# Patient Record
Sex: Male | Born: 1980 | Race: White | Hispanic: No | Marital: Married | Smoking: Never smoker
Health system: Southern US, Community
[De-identification: ages and names within clinical notes are randomized; demographics above are authoritative.]

## PROBLEM LIST (undated history)

## (undated) DIAGNOSIS — M26629 Arthralgia of temporomandibular joint, unspecified side: Secondary | ICD-10-CM

## (undated) DIAGNOSIS — W5522XA Struck by cow, initial encounter: Secondary | ICD-10-CM

## (undated) DIAGNOSIS — B6000 Babesiosis, unspecified: Secondary | ICD-10-CM

## (undated) HISTORY — PX: TONSILLECTOMY: SUR1361

## (undated) HISTORY — DX: Babesiosis, unspecified: B60.00

## (undated) HISTORY — DX: Struck by cow, initial encounter: W55.22XA

## (undated) HISTORY — DX: Arthralgia of temporomandibular joint, unspecified side: M26.629

---

## 2013-02-11 ENCOUNTER — Encounter: Payer: Self-pay | Admitting: General Practice

## 2013-02-11 ENCOUNTER — Ambulatory Visit (INDEPENDENT_AMBULATORY_CARE_PROVIDER_SITE_OTHER): Payer: BC Managed Care – PPO | Admitting: General Practice

## 2013-02-11 VITALS — BP 121/80 | HR 51 | Temp 97.4°F | Ht 69.0 in | Wt 167.5 lb

## 2013-02-11 DIAGNOSIS — L0291 Cutaneous abscess, unspecified: Secondary | ICD-10-CM

## 2013-02-11 DIAGNOSIS — L0390 Cellulitis, unspecified: Secondary | ICD-10-CM

## 2013-02-11 MED ORDER — DOXYCYCLINE HYCLATE 100 MG PO TABS: ORAL_TABLET | ORAL | Status: DC

## 2013-02-11 NOTE — Progress Notes (Signed)
  Subjective:    Patient ID: Stephen Oliver, male    DOB: 1981/01/16, 32 y.o.   MRN: [id number]  HPI Presents today with red area to left lateral foot. Reports noticing this area last Wednesday and it has gotten better, but unresolved. Denies any drainage. Denies being biten or removing tick from this area, although he removed 7 ticks from other areas in the last week. Denies OTC medications.     Review of Systems  Constitutional: Negative for fever and chills.  Respiratory: Negative for cough and chest tightness.   Cardiovascular: Negative for chest pain.  Gastrointestinal: Negative for abdominal distention.  Genitourinary: Negative for difficulty urinating.  Musculoskeletal: Negative.   Skin:       Red,  area to left lateral foot  Neurological: Negative for dizziness and headaches.  Psychiatric/Behavioral: Negative.        Objective:   Physical Exam  Constitutional: He is oriented to person, place, and time. He appears well-developed and well-nourished.  Cardiovascular: Normal rate, regular rhythm and normal heart sounds.   No murmur heard. Pulmonary/Chest: Effort normal and breath sounds normal.  Neurological: He is alert and oriented to person, place, and time.  Skin: Skin is warm and dry. Rash noted.  Erythematous area to left lateral foot. Slightly edematous, non-pitting. Negative drainage. Non-tender with palpation.   Psychiatric: He has a normal mood and affect.          Assessment & Plan:  Cellulitis - Plan: doxycycline (VIBRA-TABS) 100 MG tablet Continue antibiotics even if feeling better Increase fluid intake Keep affected area clean and dry RTO if symptoms worsen or unresolved Patient verbalized understanding Coralie Keens, FNP-C

## 2013-02-11 NOTE — Patient Instructions (Addendum)
Cellulitis Cellulitis is an infection of the skin and the tissue beneath it. The infected area is usually red and tender. Cellulitis occurs most often in the arms and lower legs.   CAUSES   Cellulitis is caused by bacteria that enter the skin through cracks or cuts in the skin. The most common types of bacteria that cause cellulitis are Staphylococcus and Streptococcus. SYMPTOMS    Redness and warmth.   Swelling.   Tenderness or pain.   Fever.  DIAGNOSIS  Your caregiver can usually determine what is wrong based on a physical exam. Blood tests may also be done. TREATMENT   Treatment usually involves taking an antibiotic medicine. HOME CARE INSTRUCTIONS    Take your antibiotics as directed. Finish them even if you start to feel better.   Keep the infected arm or leg elevated to reduce swelling.   Apply a warm cloth to the affected area up to 4 times per day to relieve pain.   Only take over-the-counter or prescription medicines for pain, discomfort, or fever as directed by your caregiver.   Keep all follow-up appointments as directed by your caregiver.  SEEK MEDICAL CARE IF:    You notice red streaks coming from the infected area.   Your red area gets larger or turns dark in color.   Your bone or joint underneath the infected area becomes painful after the skin has healed.   Your infection returns in the same area or another area.   You notice a swollen bump in the infected area.   You develop new symptoms.  SEEK IMMEDIATE MEDICAL CARE IF:    You have a fever.   You feel very sleepy.   You develop vomiting or diarrhea.   You have a general ill feeling (malaise) with muscle aches and pains.  MAKE SURE YOU:    Understand these instructions.   Will watch your condition.   Will get help right away if you are not doing well or get worse.  Document Released: 06/25/2005 Document Revised: 03/16/2012 Document Reviewed: 12/01/2011 Exit[employer] Patient Information 2013  [employer].    

## 2013-10-06 ENCOUNTER — Other Ambulatory Visit: Payer: Self-pay | Admitting: Nurse Practitioner

## 2013-10-06 MED ORDER — CEPHALEXIN 500 MG PO CAPS: 500.0000 mg | ORAL_CAPSULE | Freq: Three times a day (TID) | ORAL | Status: DC

## 2013-10-19 ENCOUNTER — Telehealth: Payer: Self-pay | Admitting: General Practice

## 2013-10-19 NOTE — Telephone Encounter (Signed)
I didn't see a tetanus on file. Appt made to be seen.

## 2013-10-20 ENCOUNTER — Ambulatory Visit (INDEPENDENT_AMBULATORY_CARE_PROVIDER_SITE_OTHER): Payer: BC Managed Care – PPO | Admitting: Family Medicine

## 2013-10-20 ENCOUNTER — Encounter: Payer: Self-pay | Admitting: Family Medicine

## 2013-10-20 VITALS — BP 112/78 | HR 58 | Temp 97.5°F | Ht 69.0 in | Wt 173.4 lb

## 2013-10-20 DIAGNOSIS — Z23 Encounter for immunization: Secondary | ICD-10-CM

## 2013-10-20 DIAGNOSIS — T148XXA Other injury of unspecified body region, initial encounter: Secondary | ICD-10-CM

## 2013-10-20 NOTE — Progress Notes (Signed)
   Subjective:    Patient ID: Stephen Oliver, male    DOB: 12-11-1980, 33 y.o.   MRN: [id number]  HPI This 33 y.o. male presents for evaluation of left foot puncture wound from Nail when he stepped on it a few days ago.  He needs a tetanus shot.   Review of Systems C/o puncture wound    No chest pain, SOB, HA, dizziness, vision change, N/V, diarrhea, constipation, dysuria, urinary urgency or frequency, myalgias, arthralgias or rash.  Objective:   Physical Exam Vital signs noted  Well developed well nourished male.  Right foot with puncture wound right bottom of foot.  No erythema or DC from the Puncture wound.       Assessment & Plan:  Need for Tdap vaccination - Plan: Tdap vaccine greater than or equal to 7yo IM  Puncture wound right foot.  Continue keflex.  Tylenol and motrin otc.  Deatra CanterWilliam J Oxford FNP

## 2013-10-20 NOTE — Progress Notes (Signed)
Tolerated injection well. 

## 2013-10-20 NOTE — Patient Instructions (Signed)
TDAPTetanus, Diphtheria, Pertussis (Tdap) Vaccine What You Need to Know WHY GET VACCINATED? Tetanus, diphtheria and pertussis can be very serious diseases, even for adolescents and adults. Tdap vaccine can protect us from these diseases. TETANUS (Lockjaw) causes painful muscle tightening and stiffness, usually all over the body.  It can lead to tightening of muscles in the head and neck so you can't open your mouth, swallow, or sometimes even breathe. Tetanus kills about 1 out of 5 people who are infected. DIPHTHERIA can cause a thick coating to form in the back of the throat.  It can lead to breathing problems, paralysis, heart failure, and death. PERTUSSIS (Whooping Cough) causes severe coughing spells, which can cause difficulty breathing, vomiting and disturbed sleep.  It can also lead to weight loss, incontinence, and rib fractures. Up to 2 in 100 adolescents and 5 in 100 adults with pertussis are hospitalized or have complications, which could include pneumonia and death. These diseases are caused by bacteria. Diphtheria and pertussis are spread from person to person through coughing or sneezing. Tetanus enters the body through cuts, scratches, or wounds. Before vaccines, the [place] saw as many as 200,000 cases a year of diphtheria and pertussis, and hundreds of cases of tetanus. Since vaccination began, tetanus and diphtheria have dropped by about 99% and pertussis by about 80%. TDAP VACCINE Tdap vaccine can protect adolescents and adults from tetanus, diphtheria, and pertussis. One dose of Tdap is routinely given at age 34 or 1012. People who did not get Tdap at that age should get it as soon as possible. Tdap is especially important for health care professionals and anyone having close contact with a baby younger than 12 months. Pregnant women should get a dose of Tdap during every pregnancy, to protect the newborn from pertussis. Infants are most at risk for severe, life-threatening  complications from pertussis. A similar vaccine, called Td, protects from tetanus and diphtheria, but not pertussis. A Td booster should be given every 10 years. Tdap may be given as one of these boosters if you have not already gotten a dose. Tdap may also be given after a severe cut or burn to prevent tetanus infection. Your doctor can give you more information. Tdap may safely be given at the same time as other vaccines. SOME PEOPLE SHOULD NOT GET THIS VACCINE  If you ever had a life-threatening allergic reaction after a dose of any tetanus, diphtheria, or pertussis containing vaccine, OR if you have a severe allergy to any part of this vaccine, you should not get Tdap. Tell your doctor if you have any severe allergies.  If you had a coma, or long or multiple seizures within 7 days after a childhood dose of DTP or DTaP, you should not get Tdap, unless a cause other than the vaccine was found. You can still get Td.  Talk to your doctor if you:  have epilepsy or another nervous system problem,  had severe pain or swelling after any vaccine containing diphtheria, tetanus or pertussis,  ever had Guillain-Barr Syndrome (GBS),  aren't feeling well on the day the shot is scheduled. RISKS OF A VACCINE REACTION With any medicine, including vaccines, there is a chance of side effects. These are usually mild and go away on their own, but serious reactions are also possible. Brief fainting spells can follow a vaccination, leading to injuries from falling. Sitting or lying down for about 15 minutes can help prevent these. Tell your doctor if you feel dizzy or light-headed, or  have vision changes or ringing in the ears. Mild problems following Tdap (Did not interfere with activities)  Pain where the shot was given (about 3 in 4 adolescents or 2 in 3 adults)  Redness or swelling where the shot was given (about 1 person in 5)  Mild fever of at least 100.4F (up to about 1 in 25 adolescents or 1 in  100 adults)  Headache (about 3 or 4 people in 10)  Tiredness (about 1 person in 3 or 4)  Nausea, vomiting, diarrhea, stomach ache (up to 1 in 4 adolescents or 1 in 10 adults)  Chills, body aches, sore joints, rash, swollen glands (uncommon) Moderate problems following Tdap (Interfered with activities, but did not require medical attention)  Pain where the shot was given (about 1 in 5 adolescents or 1 in 100 adults)  Redness or swelling where the shot was given (up to about 1 in 16 adolescents or 1 in 25 adults)  Fever over 102F (about 1 in 100 adolescents or 1 in 250 adults)  Headache (about 3 in 20 adolescents or 1 in 10 adults)  Nausea, vomiting, diarrhea, stomach ache (up to 1 or 3 people in 100)  Swelling of the entire arm where the shot was given (up to about 3 in 100). Severe problems following Tdap (Unable to perform usual activities, required medical attention)  Swelling, severe pain, bleeding and redness in the arm where the shot was given (rare). A severe allergic reaction could occur after any vaccine (estimated less than 1 in a million doses). WHAT IF THERE IS A SERIOUS REACTION? What should I look for?  Look for anything that concerns you, such as signs of a severe allergic reaction, very high fever, or behavior changes. Signs of a severe allergic reaction can include hives, swelling of the face and throat, difficulty breathing, a fast heartbeat, dizziness, and weakness. These would start a few minutes to a few hours after the vaccination. What should I do?  If you think it is a severe allergic reaction or other emergency that can't wait, call 9-1-1 or get the person to the nearest hospital. Otherwise, call your doctor.  Afterward, the reaction should be reported to the "Vaccine Adverse Event Reporting System" (VAERS). Your doctor might file this report, or you can do it yourself through the VAERS web site at www.vaers.hhs.gov, or by calling 1-800-822-7967. VAERS is  only for reporting reactions. They do not give medical advice.  THE NATIONAL VACCINE INJURY COMPENSATION PROGRAM The National Vaccine Injury Compensation Program (VICP) is a federal program that was created to compensate people who may have been injured by certain vaccines. Persons who believe they may have been injured by a vaccine can learn about the program and about filing a claim by calling 1-800-338-2382 or visiting the VICP website at www.hrsa.gov/vaccinecompensation. HOW CAN I LEARN MORE?  Ask your doctor.  Call your local or state health department.  Contact the Centers for Disease Control and Prevention (CDC):  Call 1-800-232-4636 or visit CDC's website at www.cdc.gov/vaccines. CDC Tdap Vaccine VIS (02/05/12) Document Released: 03/16/2012 Document Revised: 01/10/2013 Document Reviewed: 01/05/2013 ExitCare Patient Information 2014 [employer].  

## 2015-05-11 ENCOUNTER — Ambulatory Visit (INDEPENDENT_AMBULATORY_CARE_PROVIDER_SITE_OTHER): Payer: BLUE CROSS/BLUE SHIELD | Admitting: Family Medicine

## 2015-05-11 ENCOUNTER — Encounter (INDEPENDENT_AMBULATORY_CARE_PROVIDER_SITE_OTHER): Payer: Self-pay

## 2015-05-11 ENCOUNTER — Encounter: Payer: Self-pay | Admitting: Family Medicine

## 2015-05-11 VITALS — BP 118/81 | HR 63 | Temp 97.1°F | Ht 69.0 in | Wt 177.0 lb

## 2015-05-11 DIAGNOSIS — L03116 Cellulitis of left lower limb: Secondary | ICD-10-CM

## 2015-05-11 MED ORDER — DOXYCYCLINE HYCLATE 100 MG PO TABS
100.0000 mg | ORAL_TABLET | Freq: Two times a day (BID) | ORAL | Status: DC
Start: 2015-05-11 — End: 2017-11-06

## 2015-05-11 NOTE — Progress Notes (Signed)
Subjective:  Patient ID: Stephen Oliver, male    DOB: Feb 08, 1981  Age: 34 y.o. MRN: [id number]  CC: Insect Bite   HPI Stephen Oliver presents for erythematous lesion at the left inner thigh. No known bite. Concerned about spider possibility. No relief with use of triple antibiotic. Present for 2 days. It started out as a slight red bump perhaps a millimeter. It spread to about the size of a dime by last night by this morning it was the size of a quarter. By noon the size of a half-dollar. Now much bigger. It is mildly painful 2/10. Primarily stinging sensation made worse by pressure.  History Stephen Oliver has no past medical history on file.   He has past surgical history that includes Tonsillectomy.   His family history includes Cancer in his mother; Heart disease in his father.He reports that he has never smoked. He does not have any smokeless tobacco history on file. He reports that he does not drink alcohol or use illicit drugs.  Outpatient Prescriptions Prior to Visit  Medication Sig Dispense Refill  . cephALEXin (KEFLEX) 500 MG capsule Take 1 capsule (500 mg total) by mouth 3 (three) times daily. 30 capsule 0   No facility-administered medications prior to visit.    ROS Review of Systems  Constitutional: Negative for fever, chills and diaphoresis.  HENT: Negative.  Negative for congestion.   Respiratory: Negative.   Cardiovascular: Negative.   Gastrointestinal: Negative for nausea, vomiting, abdominal pain, diarrhea, constipation and abdominal distention.  Musculoskeletal: Negative for arthralgias.  Skin: Negative for rash.  Neurological: Negative for headaches.    Objective:  BP 118/81 mmHg  Pulse 63  Temp(Src) 97.1 F (36.2 C) (Oral)  Ht 5\' 9"  (1.753 m)  Wt 177 lb (80.287 kg)  BMI 26.13 kg/m2  BP Readings from Last 3 Encounters:  05/11/15 118/81  10/20/13 112/78  02/11/13 121/80    Wt Readings from Last 3 Encounters:  05/11/15 177 lb (80.287 kg)  10/20/13 173 lb 6.4 oz  (78.654 kg)  02/11/13 167 lb 8 oz (75.978 kg)     Physical Exam  Constitutional: He is oriented to person, place, and time. He appears well-developed and well-nourished. No distress.  HENT:  Head: Normocephalic and atraumatic.  Right Ear: External ear normal.  Left Ear: External ear normal.  Nose: Nose normal.  Mouth/Throat: Oropharynx is clear and moist.  Eyes: Conjunctivae and EOM are normal. Pupils are equal, round, and reactive to light.  Neck: Normal range of motion. Neck supple. No thyromegaly present.  Cardiovascular: Normal rate, regular rhythm and normal heart sounds.   No murmur heard. Pulmonary/Chest: Effort normal and breath sounds normal. No respiratory distress. He has no wheezes. He has no rales.  Abdominal: Soft. Bowel sounds are normal. He exhibits no distension. There is no tenderness.  Lymphadenopathy:    He has no cervical adenopathy.  Neurological: He is alert and oriented to person, place, and time. He has normal reflexes.  Skin: Skin is warm and dry. Rash (there is moderate pink ery of about 9 cm on the medial aspect of the thigh just proximal to the mid thigh. This has a 2 mm central bleb. There is no ulceration. This is tender to light palpation. There is no fluctuance.) noted.  Psychiatric: He has a normal mood and affect. His behavior is normal. Judgment and thought content normal.    No results found for: HGBA1C  No results found for: WBC, HGB, HCT, PLT, GLUCOSE, CHOL, TRIG, HDL, LDLDIRECT,  LDLCALC, ALT, AST, NA, K, CL, CREATININE, BUN, CO2, TSH, PSA, INR, GLUF, HGBA1C, MICROALBUR  Patient was never admitted.  Assessment & Plan:   Stephen Oliver was seen today for insect bite.  Diagnoses and all orders for this visit:  Cellulitis of leg, left  Other orders -     doxycycline (VIBRA-TABS) 100 MG tablet; Take 1 tablet (100 mg total) by mouth 2 (two) times daily.  I have discontinued [name] cephALEXin. I am also having him start on doxycycline.  Meds  ordered this encounter  Medications  . doxycycline (VIBRA-TABS) 100 MG tablet    Sig: Take 1 tablet (100 mg total) by mouth 2 (two) times daily.    Dispense:  20 tablet    Refill:  0     Follow-up: Return if symptoms worsen or fail to improve.  Mechele Claude, M.D.

## 2015-12-25 ENCOUNTER — Other Ambulatory Visit: Payer: Self-pay | Admitting: Family Medicine

## 2015-12-25 MED ORDER — OSELTAMIVIR PHOSPHATE 75 MG PO CAPS: 75.0000 mg | ORAL_CAPSULE | Freq: Every day | ORAL | Status: DC

## 2015-12-25 NOTE — Telephone Encounter (Signed)
Tamiflu for PPx sent, daughter seen today and is flu B positive, per wife's request.   Murtis SinkSam Debbi Strandberg, MD [facility] 12/25/2015, 1:45 PM

## 2016-10-08 ENCOUNTER — Telehealth: Payer: Self-pay

## 2016-10-08 MED ORDER — AMOXICILLIN-POT CLAVULANATE 875-125 MG PO TABS
1.0000 | ORAL_TABLET | Freq: Two times a day (BID) | ORAL | 0 refills | Status: DC
Start: 2016-10-08 — End: 2017-11-06

## 2016-10-08 NOTE — Telephone Encounter (Signed)
Please send in a scrip to the local pharmacy where he is staying. Use Augmentin 875 BID X 10 days.

## 2016-10-08 NOTE — Telephone Encounter (Signed)
[facility] [place] [facility] [place] Rx sent.

## 2016-10-08 NOTE — Telephone Encounter (Signed)
Patient works out of town and has a terrible sinus infection. He is unable to come in at this time for an appt. Are their any suggestions for what he can do. Not sure when he will be able to come in. Patient works outdoors. Please advise and route back to sender and I will contact

## 2016-10-20 ENCOUNTER — Other Ambulatory Visit: Payer: Self-pay | Admitting: Family Medicine

## 2016-10-21 ENCOUNTER — Telehealth: Payer: Self-pay

## 2016-10-21 ENCOUNTER — Other Ambulatory Visit: Payer: Self-pay | Admitting: Nurse Practitioner

## 2016-10-21 MED ORDER — OSELTAMIVIR PHOSPHATE 75 MG PO CAPS: 75.0000 mg | ORAL_CAPSULE | Freq: Every day | ORAL | 0 refills | Status: DC

## 2016-10-21 NOTE — Telephone Encounter (Signed)
He and his family were exposed to nephew yesterday who was diagnosed with the flu last night. [facility] [place]

## 2017-11-06 ENCOUNTER — Encounter: Payer: Self-pay | Admitting: Pediatrics

## 2017-11-06 ENCOUNTER — Ambulatory Visit: Payer: BLUE CROSS/BLUE SHIELD | Admitting: Pediatrics

## 2017-11-06 VITALS — BP 138/84 | HR 60 | Temp 97.0°F | Ht 69.0 in | Wt 164.8 lb

## 2017-11-06 DIAGNOSIS — R202 Paresthesia of skin: Secondary | ICD-10-CM | POA: Diagnosis not present

## 2017-11-06 DIAGNOSIS — R200 Anesthesia of skin: Secondary | ICD-10-CM

## 2017-11-06 DIAGNOSIS — R030 Elevated blood-pressure reading, without diagnosis of hypertension: Secondary | ICD-10-CM | POA: Diagnosis not present

## 2017-11-06 LAB — URINALYSIS, COMPLETE
Bilirubin, UA: NEGATIVE
GLUCOSE, UA: NEGATIVE
KETONES UA: NEGATIVE
Leukocytes, UA: NEGATIVE
NITRITE UA: NEGATIVE
PROTEIN UA: NEGATIVE
SPEC GRAV UA: 1.015 (ref 1.005–1.030)
UUROB: 0.2 mg/dL (ref 0.2–1.0)
pH, UA: 7 (ref 5.0–7.5)

## 2017-11-06 LAB — MICROSCOPIC EXAMINATION
Bacteria, UA: NONE SEEN
Epithelial Cells (non renal): NONE SEEN /hpf (ref 0–10)
Renal Epithel, UA: NONE SEEN /hpf
WBC UA: NONE SEEN /HPF (ref 0–?)
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
## 2017-11-07 LAB — CMP14+EGFR
A/G RATIO: 2.3 — AB (ref 1.2–2.2)
ALBUMIN: 5 g/dL (ref 3.5–5.5)
ALK PHOS: 63 IU/L (ref 39–117)
ALT: 10 IU/L (ref 0–44)
AST: 11 IU/L (ref 0–40)
BILIRUBIN TOTAL: 0.4 mg/dL (ref 0.0–1.2)
BUN / CREAT RATIO: 11 (ref 9–20)
BUN: 11 mg/dL (ref 6–20)
CHLORIDE: 103 mmol/L (ref 96–106)
CO2: 23 mmol/L (ref 20–29)
Calcium: 9.3 mg/dL (ref 8.7–10.2)
Creatinine, Ser: 0.99 mg/dL (ref 0.76–1.27)
GFR calc Af Amer: 112 mL/min/{1.73_m2} (ref >59)
GFR calc non Af Amer: 97 mL/min/{1.73_m2} (ref >59)
GLOBULIN, TOTAL: 2.2 g/dL (ref 1.5–4.5)
Glucose: 101 mg/dL — ABNORMAL HIGH (ref 65–99)
Potassium: 4.5 mmol/L (ref 3.5–5.2)
SODIUM: 142 mmol/L (ref 134–144)
Total Protein: 7.2 g/dL (ref 6.0–8.5)

## 2017-11-07 LAB — CBC WITH DIFFERENTIAL/PLATELET
BASOS ABS: 0 10*3/uL (ref 0.0–0.2)
Basos: 1 %
EOS (ABSOLUTE): 0.1 10*3/uL (ref 0.0–0.4)
Eos: 1 %
HEMATOCRIT: 46.3 % (ref 37.5–51.0)
HEMOGLOBIN: 16.2 g/dL (ref 13.0–17.7)
Immature Grans (Abs): 0 10*3/uL (ref 0.0–0.1)
Immature Granulocytes: 0 %
LYMPHS ABS: 1.7 10*3/uL (ref 0.7–3.1)
Lymphs: 26 %
MCH: 29.8 pg (ref 26.6–33.0)
MCHC: 35 g/dL (ref 31.5–35.7)
MCV: 85 fL (ref 79–97)
MONOCYTES: 6 %
Monocytes Absolute: 0.4 10*3/uL (ref 0.1–0.9)
NEUTROS ABS: 4.3 10*3/uL (ref 1.4–7.0)
Neutrophils: 66 %
Platelets: 182 10*3/uL (ref 150–379)
RBC: 5.44 x10E6/uL (ref 4.14–5.80)
RDW: 13.7 % (ref 12.3–15.4)
WBC: 6.5 10*3/uL (ref 3.4–10.8)

## 2017-11-07 LAB — TSH: TSH: 2.18 u[IU]/mL (ref 0.450–4.500)

## 2017-11-09 ENCOUNTER — Encounter: Payer: Self-pay | Admitting: Neurology

## 2017-12-21 ENCOUNTER — Other Ambulatory Visit: Payer: Self-pay | Admitting: Family Medicine

## 2017-12-21 MED ORDER — AMOXICILLIN 500 MG PO CAPS: 500.0000 mg | ORAL_CAPSULE | Freq: Two times a day (BID) | ORAL | 0 refills | Status: DC

## 2017-12-21 NOTE — Progress Notes (Signed)
Multiple sick contacts within the household with strep pharyngitis.  Patient now with sore throat.  He has taken 3 of his wife's amoxicillin pills with improvement in symptoms.

## 2018-02-08 ENCOUNTER — Ambulatory Visit: Payer: Self-pay | Admitting: Neurology

## 2018-02-08 ENCOUNTER — Encounter: Payer: Self-pay | Admitting: Neurology

## 2018-02-08 VITALS — BP 110/80 | HR 70 | Ht 69.0 in | Wt 170.5 lb

## 2018-02-08 DIAGNOSIS — G5601 Carpal tunnel syndrome, right upper limb: Secondary | ICD-10-CM

## 2018-02-08 DIAGNOSIS — G5623 Lesion of ulnar nerve, bilateral upper limbs: Secondary | ICD-10-CM

## 2018-02-08 NOTE — Patient Instructions (Signed)
NCS/EMG of both arms  If your symptoms get worse, we can order MRI brain  Please try to avoid over flexion at the elbow and use a soft elbow to prevent compression of the nerve at the elbow; at night, use it has a block.  Return to clinic needed

## 2018-02-08 NOTE — Progress Notes (Signed)
[facility] Neurology Division Clinic Note - Initial Visit   Date: 02/08/18  Stephen Oliver MRN: [id number] DOB: January 21, 1981   Dear Dr. Oswaldo Done:  Thank you for your kind referral of Stephen Oliver for consultation of hand numbness. Although his history is well known to you, please allow [place] to reiterate it for the purpose of our medical record. The patient was accompanied to the clinic by self.    History of Present Illness: Stephen Oliver is a 37 y.o. left-handed Caucasian male who is otherwise healthy presenting for evaluation of left sided numbness and right hand numbness.    In January 2019, he was driving in [place] and he developed left arm numbness, which progressed to involve his left face and leg.  Symptoms lasted 30-minutes.  He stopped at a rest area and a bystander called EMS.  His blood pressure was elevated and symptoms had subsided.  He endorses being anxious when symptoms started.  He has not had left sided numbness/tingling since this time.  However, over the past 6 weeks, he has been waking up with his hands falling asleep, over the 4th and 5th digits.  This can occur when he is driving, especially on the left. Shaking his hands and stretching can help.  He denies weakness of the hands or neck pain. He admits to resting his elbow on the door and arm rest.  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   Out-side paper records, electronic medical record, and images have been reviewed where available and summarized as:  Lab Results  Component Value Date   TSH 2.180 11/06/2017   No results found for: [id number] Lab Results  Component Value Date   CREATININE 0.99 11/06/2017   BUN 11 11/06/2017   NA 142 11/06/2017   K 4.5 11/06/2017   CL 103 11/06/2017   CO2 23 11/06/2017   Past Medical History:  None  Past Surgical History:  Procedure Laterality Date  . TONSILLECTOMY       Medications:  Outpatient Encounter Medications as of 02/08/2018  Medication Sig  . [DISCONTINUED] amoxicillin (AMOXIL) 500 MG capsule Take 1 capsule (500 mg total) by mouth 2 (two) times daily.   No facility-administered encounter medications on file as of 02/08/2018.      Allergies: No Known Allergies  Family History: Family History  Problem Relation Age of Onset  . Colon cancer Mother 20       colon  . Heart disease Father   . Healthy Sister   . Stroke Paternal Grandmother     Social History: Social History   Tobacco Use  . Smoking status: Never Smoker  . Smokeless tobacco: Never Used  Substance Use Topics  . Alcohol use: No  . Drug use: No   Social History   Social History Narrative   Lives with wife and 4 children in a one story home.  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  Education: some college.     Review of Systems:  CONSTITUTIONAL: No fevers, chills, night sweats, or weight loss.   EYES: No visual changes + right eye pain ENT: No hearing changes.  No history of nose bleeds.   RESPIRATORY: No cough, wheezing and shortness of breath.   CARDIOVASCULAR: Negative  for chest pain, and palpitations.   GI: Negative for abdominal discomfort, blood in stools or black stools.  No recent change in bowel habits.   GU:  No history of incontinence.   MUSCLOSKELETAL: No history of joint pain or swelling.  No myalgias.   SKIN: Negative for lesions, rash, and itching.   HEMATOLOGY/ONCOLOGY: Negative for prolonged bleeding, bruising easily, and swollen nodes.  No history of cancer.   ENDOCRINE: Negative for cold or heat intolerance, polydipsia or goiter.   PSYCH:  No depression +anxiety symptoms.   NEURO: As Above.   Vital Signs:  BP 110/80   Pulse 70   Ht  (1.753 m)   Wt 170 lb 8 oz (77.3 kg)   SpO2 99%   BMI 25.18  kg/m    General Medical Exam:   General:  Well appearing, comfortable.   Eyes/ENT: see cranial nerve examination.   Neck: No masses appreciated.  Full range of motion without tenderness.  No carotid bruits. Respiratory:  Clear to auscultation, good air entry bilaterally.   Cardiac:  Regular rate and rhythm, no murmur.   Extremities:  No deformities, edema, or skin discoloration.  Skin:  No rashes or lesions.  Neurological Exam: MENTAL STATUS including orientation to time, place, person, recent and remote memory, attention span and concentration, language, and fund of knowledge is normal.  Speech is not dysarthric.  CRANIAL NERVES: II:  No visual field defects.  Unremarkable fundi.   III-IV-VI: Pupils equal round and reactive to light.  Normal conjugate, extra-ocular eye movements in all directions of gaze.  No nystagmus.  No ptosis.   V:  Normal facial sensation.   VII:  Normal facial symmetry and movements.    VIII:  Normal hearing and vestibular function.   IX-X:  Normal palatal movement.   XI:  Normal shoulder shrug and head rotation.   XII:  Normal tongue strength and range of motion, no deviation or fasciculation.  MOTOR:  No atrophy, fasciculations or abnormal movements.  No pronator drift.  Tone is normal.    Right Upper Extremity:    Left Upper Extremity:    Deltoid  5/5   Deltoid  5/5   Biceps  5/5   Biceps  5/5   Triceps  5/5   Triceps  5/5   Wrist extensors  5/5   Wrist extensors  5/5   Wrist flexors  5/5   Wrist flexors  5/5   Finger extensors  5/5   Finger extensors  5/5   Finger flexors  5/5   Finger flexors  5/5   Dorsal interossei  5/5   Dorsal interossei  5/5   Abductor pollicis  5/5   Abductor pollicis  5/5   Tone (Ashworth scale)  0  Tone (Ashworth scale)  0   Right Lower Extremity:    Left Lower Extremity:    Hip flexors  5/5   Hip flexors  5/5   Hip extensors  5/5   Hip extensors  5/5   Knee flexors  5/5   Knee flexors  5/5   Knee extensors  5/5   Knee  extensors  5/5   Dorsiflexors  5/5   Dorsiflexors  5/5   Plantarflexors  5/5   Plantarflexors  5/5   Toe extensors  5/5   Toe extensors  5/5   Toe flexors  5/5   Toe flexors  5/5   Tone (Ashworth scale)  0  Tone (Ashworth scale)  0  MSRs:  Right                                                                 Left brachioradialis 2+  brachioradialis 2+  biceps 2+  biceps 2+  triceps 2+  triceps 2+  patellar 2+  patellar 2+  ankle jerk 2+  ankle jerk 2+  Hoffman no  Hoffman no  plantar response down  plantar response down   SENSORY:  Normal and symmetric perception of light touch, pinprick, vibration, and proprioception.  Romberg's sign absent. Tinel's sign at the wrist wrist.  Negative Tinel's at the elbow.   COORDINATION/GAIT: Normal finger-to- nose-finger.  Intact rapid alternating movements bilaterally. Gait narrow based and stable. Tandem and stressed gait intact.    IMPRESSION: 1.  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  I will obtain NCS/EMG of the arms to localize symptoms and assess severity.  In the meantime, strategies were discussed to minimize compression of the nerve including using a soft elbow pad and using it as a block at night to prevent hyperflexion.  2.  Right carpal tunnel syndrome, mild and asymptomatic as he denies paresthesia over the median distribution but Tinel's is positive.  NCS/EMG will also assess for this.   3.  Transient left sided paresthesia, less likely TIA or stroke given lack of vascular risk factors.  It is possible that he developed left ulnar neuropathy at the elbow and the remaining symptoms were more a manifestation of anxiety.  I offered to check MRI brain to be sure there is no intracranial pathology, but he would like to hold on imaging as he does not have insurance and not had any further spells.    Return to clinic as needed.  If he develops new  lateralizing symptoms, low threshold to proceed with imaging.   Thank you for allowing me to participate in patient's care.  If I can answer any additional questions, I would be pleased to do so.    Sincerely,    Donika K. Allena Katz, DO

## 2018-02-16 ENCOUNTER — Encounter: Payer: Self-pay | Admitting: Neurology

## 2018-03-15 ENCOUNTER — Ambulatory Visit (INDEPENDENT_AMBULATORY_CARE_PROVIDER_SITE_OTHER): Payer: Self-pay | Admitting: Pediatrics

## 2018-03-15 ENCOUNTER — Encounter: Payer: Self-pay | Admitting: Pediatrics

## 2018-03-15 VITALS — BP 133/91 | HR 61 | Temp 97.9°F | Ht 69.0 in | Wt 175.0 lb

## 2018-03-15 DIAGNOSIS — R200 Anesthesia of skin: Secondary | ICD-10-CM

## 2018-03-15 DIAGNOSIS — R29818 Other symptoms and signs involving the nervous system: Secondary | ICD-10-CM

## 2018-03-15 NOTE — Progress Notes (Signed)
Subjective:   Patient ID: Stephen Oliver, male    DOB: 11/21/1980, 37 y.o.   MRN: [id number] CC: Dizziness and Tingling  HPI: Stephen Oliver is a 37 y.o. male   Since last visit continues to have intermittent lightheadedness associated with left arm tingling, sometimes upper left leg tingling.  Episode a week and a half ago the lightheadedness started, left arm tingling started, he was driving at the time, had to pull over the side of the road.  Threw up.  Felt better after about 20 - 30 minutes.  The arm and leg tingling usually last just about 30 minutes.  Starting 4 days ago, he had left arm tingling that lasted for 2 to 3 days, he felt very tired and not himself, not able to focus on what his kids were saying to him, just wanted to lay in bed.  No seizure activity that he knows of.  No weakness in either his arms or his legs.  No chest pressure or chest pain.  He does not feel anxious when these episodes come on, he does feel like he needs to lie down, sit down or he might pass out.  He has not passed out with any of these episodes.  Sometimes brachioradialis muscle and left forearm ALT which when he has the tingling.  Once he started feeling lightheaded, says he felt something "pop", is not sure where, started having tingling that went up the back of his head on both sides.around his face with some numbness.  This lasted for about 20 minutes before resolving.  He had left arm numbness at the same time.  He was seen by neurology 1 month ago for the same symptoms.  Recommended brain imaging, EMG to evaluate muscles in the arm. He declined further tests at that time as patient is self-pay now, hoping to get insurance back in the fall.  He wants to know what next dose would be as he feels these episodes are happening more often. He does check his blood pressure regularly at home, including during these episodes. Systolics 110-115 / 70s diastolic.  He says he is feeling nervous today anything except for his  blood pressure is up.  Has had pain off and on for a few years and the soft tissue muscles below his right scapula.  Relevant past medical, surgical, family and social history reviewed. Allergies and medications reviewed and updated. Social History   Tobacco Use  Smoking Status Never Smoker  Smokeless Tobacco Never Used   ROS: Per HPI   Objective:    BP (!) 133/91   Pulse 61   Temp 97.9 F (36.6 C) (Oral)   Ht 5\' 9"  (1.753 m)   Wt 175 lb (79.4 kg)   BMI 25.84 kg/m   Wt Readings from Last 3 Encounters:  03/15/18 175 lb (79.4 kg)  02/08/18 170 lb 8 oz (77.3 kg)  11/06/17 164 lb 12.8 oz (74.8 kg)    Gen: NAD, alert, cooperative with exam, NCAT EYES: EOMI, no conjunctival injection, or no icterus ENT:  OP without erythema CV: NRRR, normal S1/S2, no murmur, distal pulses 2+ b/l Resp: CTABL, no wheezes, normal WOB Abd: +BS, soft, NTND.  Ext: No edema, warm Neuro: Alert and oriented, strength equal b/l UE and LE, patellar reflex 2+ bilaterally, coordination grossly normal, CN III-XII  Intact.  Sensation intact to touch, temperature bilateral extremities. MSK: no point tenderness over spine.  Normal range of motion of neck, no exacerbation of symptoms.  Assessment &  Plan:  [place] was seen today for dizziness and tingling.  Diagnoses and all orders for this visit:  Left arm numbness Etiology of episodes unclear.  Normal neuro exam today.  Will get MRI of brain.  Return precautions discussed. -     MR Brain W Wo Contrast; Future  Other symptoms and signs involving the nervous system -     MR Brain W Wo Contrast; Future   Follow up plan: Return in about 1 month (around 04/12/2018). Rex Kras, MD [facility]

## 2018-04-13 ENCOUNTER — Telehealth: Payer: Self-pay

## 2018-04-13 NOTE — Telephone Encounter (Signed)
Pt. Needs to be seen for this. Thanks, WS 

## 2018-04-13 NOTE — Telephone Encounter (Signed)
I could see him tomorrow afternoon. WS

## 2018-04-13 NOTE — Telephone Encounter (Signed)
Patient has a toothache and has an appt with his dentist next week. Dentist will not call in an antibiotic until he is seen. They advised to contact family Dr. Evaristo Buryan something be called in? Please advise

## 2018-04-13 NOTE — Telephone Encounter (Signed)
He is self pay. Could something be called in? If not he doesn't mind coming in

## 2018-04-14 NOTE — Telephone Encounter (Signed)
Patient aware.

## 2018-05-07 ENCOUNTER — Other Ambulatory Visit: Payer: Self-pay

## 2018-05-07 MED ORDER — AMOXICILLIN-POT CLAVULANATE 875-125 MG PO TABS: 1.0000 | ORAL_TABLET | Freq: Two times a day (BID) | ORAL | 0 refills | Status: DC

## 2018-05-13 ENCOUNTER — Ambulatory Visit (INDEPENDENT_AMBULATORY_CARE_PROVIDER_SITE_OTHER): Payer: Self-pay | Admitting: Family

## 2018-05-13 ENCOUNTER — Telehealth: Payer: Self-pay | Admitting: Pediatrics

## 2018-05-13 ENCOUNTER — Encounter: Payer: Self-pay | Admitting: Family

## 2018-05-13 VITALS — BP 127/85 | HR 65 | Temp 97.1°F | Ht 69.0 in | Wt 168.2 lb

## 2018-05-13 DIAGNOSIS — F411 Generalized anxiety disorder: Secondary | ICD-10-CM

## 2018-05-13 DIAGNOSIS — K047 Periapical abscess without sinus: Secondary | ICD-10-CM

## 2018-05-13 DIAGNOSIS — R200 Anesthesia of skin: Secondary | ICD-10-CM

## 2018-05-13 DIAGNOSIS — G939 Disorder of brain, unspecified: Secondary | ICD-10-CM

## 2018-05-13 MED ORDER — BUSPIRONE HCL 10 MG PO TABS: 10.0000 mg | ORAL_TABLET | Freq: Three times a day (TID) | ORAL | 1 refills | Status: DC

## 2018-05-13 NOTE — Progress Notes (Signed)
   Subjective:    Patient ID: Stephen Oliver, male    DOB: September 09, 1981, 37 y.o.   MRN: [id number]  Chief Complaint  Patient presents with  . abcess tooth  . Numbness    left side    HPI PT presents to the office today with numbness in left head, neck, and arm. He went to the ED on  03/19/18 and had a MRI that was negative acute abnormality. It did show " Approximately 5 mm focal T2 hyperintense lesion in the subcortical white matter of the left temporal lobe (series 7, image 10)". It was recommended to follow up with a neurologists and have a repeat MRI in 3 months.   PT was seen by his dentist two days ago and was told he had an abscess tooth. He was started on Augmentin without relief of symptoms.    [facility] notes reviewed.    Review of Systems  All other systems reviewed and are negative.      Objective:   Physical Exam  Constitutional: He is oriented to person, place, and time. He appears well-developed and well-nourished. No distress.  HENT:  Head: Normocephalic.  Right Ear: External ear normal.  Left Ear: External ear normal.  Mouth/Throat: Oropharynx is clear and moist.  Eyes: Pupils are equal, round, and reactive to light. Right eye exhibits no discharge. Left eye exhibits no discharge.  Neck: Normal range of motion. Neck supple. No thyromegaly present.  Cardiovascular: Normal rate, regular rhythm, normal heart sounds and intact distal pulses.  No murmur heard. Pulmonary/Chest: Effort normal and breath sounds normal. No respiratory distress. He has no wheezes.  Abdominal: Soft. Bowel sounds are normal. He exhibits no distension. There is no tenderness.  Musculoskeletal: Normal range of motion. He exhibits no edema or tenderness.  Neurological: He is alert and oriented to person, place, and time. He has normal reflexes. He displays normal reflexes. No cranial nerve deficit or sensory deficit. He exhibits normal muscle tone. Coordination normal.  No deficit noted     Skin: Skin is warm and dry. No rash noted. No erythema.  Psychiatric: He has a normal mood and affect. His behavior is normal. Judgment and thought content normal.  Vitals reviewed.     BP 127/85   Pulse 65   Temp (!) 97.1 F (36.2 C) (Oral)   Ht 5\' 9"  (1.753 m)   Wt 168 lb 3.2 oz (76.3 kg)   BMI 24.84 kg/m      Assessment & Plan:  Stephen Oliver comes in today with chief complaint of abcess tooth and Numbness (left side)   Diagnosis and orders addressed:  1. Left facial numbness - Ambulatory referral to Neurology  2. Left arm numbness - Ambulatory referral to Neurology  3. Temporal lobe lesion - Ambulatory referral to Neurology  4. GAD (generalized anxiety disorder) - busPIRone (BUSPAR) 10 MG tablet; Take 1 tablet (10 mg total) by mouth 3 (three) times daily.  Dispense: 90 tablet; Refill: 1  5. Abscessed tooth Continue Augmentin  PT very anxious today and states he feels like if this keeps going he might die I will do stat referral to Neurologists  Reviewed scans he had in ED and discussed the temporal lesion that was found Will give Buspar as needed for anxiety Continue Augmentin for abscess tooth Keep follow up with PCP and if pain worsens go to ED   Jannifer Rodneyhristy Amarien Carne, FNP   Jannifer Rodneyhristy Rogelio Waynick, FNP

## 2018-05-13 NOTE — Patient Instructions (Signed)
Dental Abscess A dental abscess is a collection of pus in or around a tooth. What are the causes? This condition is caused by a bacterial infection around the root of the tooth that involves the inner part of the tooth (pulp). It may result from:  Severe tooth decay.  Trauma to the tooth that allows bacteria to enter into the pulp, such as a broken or chipped tooth.  Severe gum disease around a tooth.  What are the signs or symptoms? Symptoms of this condition include:  Severe pain in and around the infected tooth.  Swelling and redness around the infected tooth, in the mouth, or in the face.  Tenderness.  Pus drainage.  Bad breath.  Bitter taste in the mouth.  Difficulty swallowing.  Difficulty opening the mouth.  Nausea.  Vomiting.  Chills.  Swollen neck glands.  Fever.  How is this diagnosed? This condition is diagnosed with examination of the infected tooth. During the exam, your dentist may tap on the infected tooth. Your dentist will also ask about your medical and dental history and may order X-rays. How is this treated? This condition is treated by eliminating the infection. This may be done with:  Antibiotic medicine.  A root canal. This may be performed to save the tooth.  Pulling (extracting) the tooth. This may also involve draining the abscess. This is done if the tooth cannot be saved.  Follow these instructions at home:  Take medicines only as directed by your dentist.  If you were prescribed antibiotic medicine, finish all of it even if you start to feel better.  Rinse your mouth (gargle) often with salt water to relieve pain or swelling.  Do not drive or operate heavy machinery while taking pain medicine.  Do not apply heat to the outside of your mouth.  Keep all follow-up visits as directed by your dentist. This is important. Contact a health care provider if:  Your pain is worse and is not helped by medicine. Get help right away  if:  You have a fever or chills.  Your symptoms suddenly get worse.  You have a very bad headache.  You have problems breathing or swallowing.  You have trouble opening your mouth.  You have swelling in your neck or around your eye. This information is not intended to replace advice given to you by your health care provider. Make sure you discuss any questions you have with your health care provider. Document Released: 09/15/2005 Document Revised: 01/24/2016 Document Reviewed: 09/12/2014 Elsevier Interactive Patient Education  2018 [employer]vier Inc[employer]  

## 2018-05-13 NOTE — Telephone Encounter (Signed)
Spoke with wife Merry ProudBrandi, they have scheduled an appointment in night clinic tonight at 5:45 pm.

## 2018-05-21 ENCOUNTER — Encounter: Payer: Self-pay | Admitting: Pediatrics

## 2018-05-21 ENCOUNTER — Ambulatory Visit (INDEPENDENT_AMBULATORY_CARE_PROVIDER_SITE_OTHER): Payer: Self-pay | Admitting: Pediatrics

## 2018-05-21 VITALS — BP 120/87 | HR 72 | Temp 97.5°F | Ht 69.0 in | Wt 167.6 lb

## 2018-05-21 DIAGNOSIS — R29810 Facial weakness: Secondary | ICD-10-CM

## 2018-05-21 DIAGNOSIS — R202 Paresthesia of skin: Secondary | ICD-10-CM

## 2018-05-21 NOTE — Progress Notes (Signed)
Subjective:   Patient ID: Stephen Oliver, male    DOB: 01-06-1981, 37 y.o.   MRN: [id number] CC: tingling (Left Side)  HPI: Stephen Oliver is a 37 y.o. male   Continues to have intermittent episodes of L sided tingling.  Happening more frequently.  Sometimes just L arm, sometimes all of left side, happening every three days, lasts for a few hours to a full day. Can go home, sleep it off and sometimes that helps.    Has had 3 episodes in the last week. 1 week ago he felt his left arm started to go numb/tingly, he was at work.  Coworker noticed L facial droop, lasted for an hour.   Yesterday at lunch time after getting back in car after eating had L sided tingling started acutely in his left temple then his whole left side of his face was involved on the left side of his trunk, left arm, left leg.  No weakness in his arm or leg.  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 notes available through care everywhere.  Recommended follow up MRI in 6 to 12 months.  He was seen by neurology while in the ED.  Is prescribed verapamil to treat migraines as a possible cause of these events.  He did not start the verapamil.  He has a follow-up appt with neurology on Monday.  He has multiple abscessed teeth that he is getting removed within the next couple weeks.  His dentist has told him that this could be contributing to some of his symptoms.  When he was first started on antibiotics to treat abscessed tooth several weeks ago he had 8 days without any symptoms for the first time in 6 months.  Symptoms have returned since then as above.  He is left-handed.  He thinks his left arm is not as big compared to what it used to be.  He does get easily fatigued.  Is not able to go for bike rides as much as he used to be able to do.  He had a normal echo the emergency room.  No chest pressure or chest pain with exertion.  He thinks he will have insurance  starting January 2020.  Relevant past medical, surgical, family and social history reviewed. Allergies and medications reviewed and updated. Social History   Tobacco Use  Smoking Status Never Smoker  Smokeless Tobacco Never Used   ROS: Per HPI   Objective:    BP 120/87   Pulse 72   Temp (!) 97.5 F (36.4 C) (Oral)   Ht 5\' 9"  (1.753 m)   Wt 167 lb 9.6 oz (76 kg)   BMI 24.75 kg/m   Wt Readings from Last 3 Encounters:  05/21/18 167 lb 9.6 oz (76 kg)  05/13/18 168 lb 3.2 oz (76.3 kg)  03/15/18 175 lb (79.4 kg)    Gen: NAD, alert, cooperative with exam, NCAT EYES: EOMI, no conjunctival injection, or no icterus ENT: OP without erythema LYMPH: no cervical LAD CV: NRRR, normal S1/S2, no murmur, distal pulses 2+ b/l Resp: CTABL, no wheezes, normal WOB Abd: +BS, soft, NTND. no guarding or organomegaly Ext: No edema, warm Neuro: Alert and oriented, sensation equal bilateral extremities.  Strength equal b/l hand grip, abduction/adduction, ext/flex at elbow, knee. Patellar reflex 2+ b/l. coordination grossly normal MSK: normal muscle bulk  Assessment & Plan:  Stephen Oliver was seen today for numbness.  Diagnoses and all  orders for this visit:  Tingling  Facial droop Reviewed previous records and imaging with patient.  Discussed reasonable to try verapamil as treatment for migraines per prior [facility] neurology recommendations, he is going to wait to d/w neurologist on Monday. Any worsening symptoms needs to be seen.  Follow up plan: 3 mo, sooner prn Rex Kras, MD [facility] [place]

## 2018-05-24 ENCOUNTER — Ambulatory Visit: Payer: Self-pay | Admitting: Neurology

## 2018-05-24 ENCOUNTER — Encounter: Payer: Self-pay | Admitting: Neurology

## 2018-05-24 ENCOUNTER — Other Ambulatory Visit (INDEPENDENT_AMBULATORY_CARE_PROVIDER_SITE_OTHER): Payer: Self-pay

## 2018-05-24 VITALS — BP 110/70 | HR 55 | Ht 69.0 in | Wt 167.5 lb

## 2018-05-24 DIAGNOSIS — R9082 White matter disease, unspecified: Secondary | ICD-10-CM

## 2018-05-24 DIAGNOSIS — G5601 Carpal tunnel syndrome, right upper limb: Secondary | ICD-10-CM

## 2018-05-24 DIAGNOSIS — R29818 Other symptoms and signs involving the nervous system: Secondary | ICD-10-CM

## 2018-05-24 DIAGNOSIS — G5623 Lesion of ulnar nerve, bilateral upper limbs: Secondary | ICD-10-CM

## 2018-05-24 LAB — VITAMIN B12: Vitamin B-12: 349 pg/mL (ref 211–911)

## 2018-05-24 NOTE — Progress Notes (Signed)
Follow-up Visit   Date: 05/24/18    Stephen EngelsJoel Fickling MRN: [id number] DOB: 08/25/1981   Interim History: Stephen Oliver is a 37 y.o. left-handed Caucasian male returning to the clinic for follow-up of left sided numbness and right hand numbness.  The patient was accompanied to the clinic by self.  History of present illness: He was seen in the clinic in May 2019 for left sided numbness and right hand numbness.  In January 2019, he was driving in [place] and he developed left arm numbness, which progressed to involve his left face and leg.  Symptoms lasted 30-minutes.  He stopped at a rest area and a bystander called EMS.  His blood pressure was elevated and symptoms had subsided.  He endorses being anxious when symptoms started.  He has not had left sided numbness/tingling since this time.  However, over the past 6 weeks, he has been waking up with his hands falling asleep, over the 4th and 5th digits.  This can occur when he is driving, especially on the left. Shaking his hands and stretching can help.  He denies weakness of the hands or neck pain. He admits to resting his elbow on the door and arm rest.  He was previously working in the field doing more physical labor and since his uncle retired from [employer], he was transitioned to [employer] position in October 2019.  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   UPDATE 05/24/2018:  At his last visit in May 2019, he was recommended to have NCS/EMG of the arms, but he did not return for testing stating that he did not think it was a problem in the arms.  Since his last visit, he reports having spells of left temporal pain, left sided numbness, chest discomfort, urinary urgency, and shortness of breath, as if he was having a panic attack.  [facility] ED on [facility] for these symptoms due to fear of stroke and had MRI brain which  did not show acute stroke, there was a small 5mm left temporal  lobe, likely an area of gliosis, recommend contrasted MRI in 6-12 months for follow-up.  He continues to have spells of entire left side of the body going numb, which can last for up to 2 days.  His hands also continue to get numbness and tingling.  He is concerned whether all his symptoms are due to his dental abscesses and will be getting a 3rd opinion from a dentist prior to having teeth extracted later this week.   He has many questions about his symptoms.   Medications:  Current Outpatient Medications on File Prior to Visit  Medication Sig Dispense Refill  . busPIRone (BUSPAR) 10 MG tablet Take 1 tablet (10 mg total) by mouth 3 (three) times daily. (Patient not taking: Reported on 05/24/2018) 90 tablet 1   No current facility-administered medications on file prior to visit.     Allergies: No Known Allergies  Review of Systems:  CONSTITUTIONAL: No fevers, chills, night sweats, or weight loss.  EYES: No visual changes or eye pain ENT: No hearing changes.  No history of nose bleeds.   RESPIRATORY: No cough, wheezing and shortness of breath.   CARDIOVASCULAR: Negative for chest pain, and palpitations.   GI: Negative for abdominal discomfort, blood in stools or black stools.  No recent change in bowel habits.   GU:  No history of incontinence.  MUSCLOSKELETAL: No history of joint pain or swelling.  +myalgias.   SKIN: Negative for lesions, rash, and itching.   ENDOCRINE: Negative for cold or heat intolerance, polydipsia or goiter.   PSYCH:  No depression +anxiety symptoms.   NEURO: As Above.   Vital Signs:  BP 110/70   Pulse (!) 55   Ht 5\' 9"  (1.753 m)   Wt 167 lb 8 oz (76 kg)   SpO2 97%   BMI 24.74 kg/m   General Medical Exam:   General:  Well appearing, comfortable  Eyes/ENT: see cranial nerve examination.   Neck:  Full range of motion without tenderness.  No carotid bruits. Respiratory:  Clear to auscultation, good air entry bilaterally.   Cardiac:  Regular rate and rhythm,  no murmur.   Ext:  No edema  Neurological Exam: MENTAL STATUS including orientation to time, place, person, recent and remote memory, attention span and concentration, language, and fund of knowledge is normal.  Speech is not dysarthric.  CRANIAL NERVES: No visual field defects. Pupils equal round and reactive to light.  Normal conjugate, extra-ocular eye movements in all directions of gaze.  No ptosis. Normal facial sensation.  Face is symmetric. Palate elevates symmetrically.  Tongue is midline.  MOTOR:  Motor strength is 5/5 in all extremities.  No atrophy, fasciculations or abnormal movements.  No pronator drift.  Tone is normal.    MSRs:  Reflexes are 2+/4 throughout  SENSORY:  Pin prick is reduced over the left 4th and 5th digit.  Sensation to temperature and vibration intact throughout.  Positive Tinel's sign at the right wrist.  COORDINATION/GAIT:  Normal finger-to- nose-finger and heel-to-shin.  Intact rapid alternating movements bilaterally.  Gait narrow based and stable.   Data: MRI brain wo contrast 03/19/2018: 1.No acute intracranial abnormality. Specifically, no acute infarction. 2.Round 5 mm focal T2 hyperintensity within the subcortical white matter of the left temporal lobe. This is nonspecific and likely reflects gliosis from remote insult; however, given the absence of any other white matter lesions and the rounded nature of this focus, recommend follow-up MRI of the brain with and without contrast in 6-12 months as a low-grade glioma could have this appearance. This finding was not included in the preliminary report, but was discussed with Dr. Carlena Sax while the patient was in the emergency department by Dr. Etheleen Mayhew at 10:00 AM on 03/19/2018.   IMPRESSION/PLAN: 1.  Right carpal tunnel syndrome 2.  Left cubital tunnel syndrome 3.  Transient left sided paresthesias involving the face, arm, and leg lasting up to 2 days.  MRI brain does not show structural lesion which would  cause these symptoms.  Spells are too long for sensory seizure. Absence of UMN findings argues against cervical lesion.  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  Will need to continue to monitor. 5.  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  This abnormality would not cause his left sided symptoms.  PLAN/RECOMMENDATIONS:  1.  NCS/EMG of the arms 2.  Check vitamin B12, MMA 3.  Requested patient to bring images on CD for me to review 4.  Repeat MRI brain wwo contrast in 6 months  Further recommendations based on testing  The duration of this appointment visit was 40 minutes of face-to-face time with the patient.  Greater than 50% of this time was spent in counseling, explanation of diagnosis, planning of further management, and coordination of care as he had  many questions and highly anxious about his symptoms.   Thank you for allowing me to participate in patient's care.  If I can answer any additional questions, I would be pleased to do so.    Sincerely,     K. Allena Katz, DO

## 2018-05-24 NOTE — Patient Instructions (Addendum)
NCS/EMG of the arms  Check labs  We will call you with the results and let you know the next step

## 2018-05-27 ENCOUNTER — Encounter: Payer: Self-pay | Admitting: *Deleted

## 2018-05-27 ENCOUNTER — Telehealth: Payer: Self-pay | Admitting: *Deleted

## 2018-05-27 LAB — METHYLMALONIC ACID, SERUM: Methylmalonic Acid, Quant: 190 nmol/L (ref 87–318)

## 2018-05-27 NOTE — Telephone Encounter (Signed)
Attempted to contact patient but mailbox is full.  Will send letter.

## 2018-05-27 NOTE — Telephone Encounter (Signed)
-----   Message from [place] Chardonika K Patel, DO sent at 05/27/2018  9:13 AM EDT ----- Please notify patient lab are within normal limits.  Thank you.

## 2018-06-10 ENCOUNTER — Ambulatory Visit (INDEPENDENT_AMBULATORY_CARE_PROVIDER_SITE_OTHER): Payer: Self-pay | Admitting: Neurology

## 2018-06-10 DIAGNOSIS — G5623 Lesion of ulnar nerve, bilateral upper limbs: Secondary | ICD-10-CM

## 2018-06-10 NOTE — Procedures (Signed)
[facility]  [address] [place], [address]  [place], [place] [address] Tel: [phone] Fax:  [phone] Test Date:  06/10/2018  Patient: Stephen Oliver DOB: 1980/11/22 Physician: Nita Sickle, DO  Sex: Male Height: 5\' 9"  Ref Phys: Nita Sickle, DO  ID#: [id number] Temp: 34.5C Technician:    Patient Complaints: This is a 37 year-old man referred for evaluation of bilateral hand paresthesias.  NCV & EMG Findings: Extensive electrodiagnostic testing of the right upper extremity and additional studies of the left shows:  1. Bilateral median, ulnar, and mixed palmar sensory responses are within normal limits. 2. Bilateral median motor responses are within normal limits. Bilateral ulnar motor responses show slowed conduction velocity across the elbow (A Elbow-B Elbow, L48, R48 m/s).   3. There is no evidence of active or chronic motor axon loss changes affecting any of the tested muscles. Motor unit configuration and recruitment pattern is within normal limits.   Impression: 1. Bilateral ulnar neuropathy with slowing across the elbow, purely demyelinating in type and mild in degree electrically. 2. There is no evidence of carpal tunnel syndrome or cervical radiculopathy affecting the upper extremities.   ___________________________ Nita Sickle, DO    Nerve Conduction Studies Anti Sensory Summary Table   Site NR Peak (ms) Norm Peak (ms) P-T Amp (V) Norm P-T Amp  Left Median Anti Sensory (2nd Digit)  34.5C  Wrist    2.8 <3.4 38.5 >20  Right Median Anti Sensory (2nd Digit)  34.5C  Wrist    2.7 <3.4 36.8 >20  Left Ulnar Anti Sensory (5th Digit)  34.5C  Wrist    2.8 <3.1 30.2 >12  Right Ulnar Anti Sensory (5th Digit)  34.5C  Wrist    2.5 <3.1 27.7 >12   Motor Summary Table   Site NR Onset (ms) Norm Onset (ms) O-P Amp (mV) Norm O-P Amp Site1 Site2 Delta-0 (ms) Dist (cm) Vel (m/s) Norm Vel (m/s)  Left Median Motor (Abd Poll Brev)  34.5C  Wrist    2.7 <3.9 10.8 >6 Elbow  Wrist 5.4 31.0 57 >50  Elbow    8.1  10.8         Right Median Motor (Abd Poll Brev)  34.5C  Wrist    2.5 <3.9 9.9 >6 Elbow Wrist 5.5 30.0 55 >50  Elbow    8.0  9.6         Left Ulnar Motor (Abd Dig Minimi)  34.5C  Wrist    2.3 <3.1 14.3 >7 B Elbow Wrist 3.8 26.0 68 >50  B Elbow    6.1  14.1  A Elbow B Elbow 2.1 10.0 48 >50  A Elbow    8.2  14.0         Right Ulnar Motor (Abd Dig Minimi)  34.5C  Wrist    2.1 <3.1 15.5 >7 B Elbow Wrist 4.0 25.0 63 >50  B Elbow    6.1  14.2  A Elbow B Elbow 2.1 10.0 48 >50  A Elbow    8.2  13.5          Comparison Summary Table   Site NR Peak (ms) Norm Peak (ms) P-T Amp (V) Site1 Site2 Delta-P (ms) Norm Delta (ms)  Left Median/Ulnar Palm Comparison (Wrist - 8cm)  34.5C  Median Palm    1.9 <2.2 43.4 Median Palm Ulnar Palm 0.2   Ulnar Palm    1.7 <2.2 18.4      Right Median/Ulnar Palm Comparison (Wrist - 8cm)  34.5C  Median Palm    1.7 <2.2 34.8 Median Palm Ulnar Palm 0.1   Ulnar Palm    1.8 <2.2 21.3       EMG   Side Muscle Ins Act Fibs Psw Fasc Number Recrt Dur Dur. Amp Amp. Poly Poly. Comment  Right 1stDorInt Nml Nml Nml Nml Nml Nml Nml Nml Nml Nml Nml Nml N/A  Right PronatorTeres Nml Nml Nml Nml Nml Nml Nml Nml Nml Nml Nml Nml N/A  Right Biceps Nml Nml Nml Nml Nml Nml Nml Nml Nml Nml Nml Nml N/A  Right Triceps Nml Nml Nml Nml Nml Nml Nml Nml Nml Nml Nml Nml N/A  Right Deltoid Nml Nml Nml Nml Nml Nml Nml Nml Nml Nml Nml Nml N/A  Right ABD Dig Min Nml Nml Nml Nml Nml Nml Nml Nml Nml Nml Nml Nml N/A  Right FlexDigProf 4,5 Nml Nml Nml Nml Nml Nml Nml Nml Nml Nml Nml Nml N/A  Left 1stDorInt Nml Nml Nml Nml Nml Nml Nml Nml Nml Nml Nml Nml N/A  Left PronatorTeres Nml Nml Nml Nml Nml Nml Nml Nml Nml Nml Nml Nml N/A  Left Biceps Nml Nml Nml Nml Nml Nml Nml Nml Nml Nml Nml Nml N/A  Left Triceps Nml Nml Nml Nml Nml Nml Nml Nml Nml Nml Nml Nml N/A  Left Deltoid Nml Nml Nml Nml Nml Nml Nml Nml Nml Nml Nml Nml N/A  Left ABD Dig Min Nml Nml Nml Nml Nml  Nml Nml Nml Nml Nml Nml Nml N/A      Waveforms:

## 2018-06-10 NOTE — Progress Notes (Signed)
Follow-up Visit   Date: 06/10/18    Reyaan Thoma MRN: [id number] DOB: 12-30-1980   Interim History: Stephen Oliver is a 37 y.o. left-handed Caucasian male returning to the clinic for follow-up of bilateral hand numbness and electrodiagnostic testing.  The patient was accompanied to the clinic by self.  History of present illness: He was seen in the clinic in May 2019 for left sided numbness and right hand numbness.  In January 2019, he was driving in [place] and he developed left arm numbness, which progressed to involve his left face and leg.  Symptoms lasted 30-minutes.  He stopped at a rest area and a bystander called EMS.  His blood pressure was elevated and symptoms had subsided.  He endorses being anxious when symptoms started.  He has not had left sided numbness/tingling since this time.  However, over the past 6 weeks, he has been waking up with his hands falling asleep, over the 4th and 5th digits.  This can occur when he is driving, especially on the left. Shaking his hands and stretching can help.  He denies weakness of the hands or neck pain. He admits to resting his elbow on the door and arm rest.  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   UPDATE 05/24/2018:  At his last visit in May 2019, he was recommended to have NCS/EMG of the arms, but he did not return for testing stating that he did not think it was a problem in the arms.  Since his last visit, he reports having spells of left temporal pain, left sided numbness, chest discomfort, urinary urgency, and shortness of breath, as if he was having a panic attack.  He went to [facility] ED on 6/21 for these symptoms due to fear of stroke and had MRI brain which  did not show acute stroke, there was a small 5mm left  temporal lobe, likely an area of gliosis, recommend contrasted MRI in 6-12 months for follow-up.  He continues to have spells of entire left side of the body going numb, which can last for up to 2 days.  His hands also continue to get numbness and tingling.  He is concerned whether all his symptoms are due to his dental abscesses and will be getting a 3rd opinion from a dentist prior to having teeth extracted later this week.   He has many questions about his symptoms.   UPDATE 06/10/2018:  He is here to undergo electrodiagnostic testing of the arms. He continues to wake up almost nightly with her hands falling asleep.  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  Medications:  Current Outpatient Medications on File Prior to Visit  Medication Sig Dispense Refill  . busPIRone (BUSPAR) 10 MG tablet Take 1 tablet (10 mg total) by mouth 3 (three) times daily. (Patient not taking: Reported on 05/24/2018) 90 tablet 1   No current facility-administered medications on file prior to visit.     Allergies: No Known Allergies  Review of Systems:  CONSTITUTIONAL: No fevers, chills, night sweats, or weight loss.  EYES: No visual changes or eye pain ENT:  No hearing changes.  No history of nose bleeds.   RESPIRATORY: No cough, wheezing and shortness of breath.   CARDIOVASCULAR: Negative for chest pain, and palpitations.   GI: Negative for abdominal discomfort, blood in stools or black stools.  No recent change in bowel habits.   GU:  No history of incontinence.   MUSCLOSKELETAL: No history of joint pain or swelling.  +myalgias.   SKIN: Negative for lesions, rash, and itching.   ENDOCRINE: Negative for cold or heat intolerance, polydipsia or goiter.   PSYCH:  No depression +anxiety symptoms.   NEURO: As Above.   Neurological Exam:  He is awake, oriented, and appropriately  engages in conversation.  Face is symmetric. Motor strength is 5 out of 5 throughout. Gait is normal.   Data: MRI brain wo contrast 03/19/2018: 1.No acute intracranial abnormality. Specifically, no acute infarction. 2.Round 5 mm focal T2 hyperintensity within the subcortical white matter of the left temporal lobe. This is nonspecific and likely reflects gliosis from remote insult; however, given the absence of any other white matter lesions and the rounded nature of this focus, recommend follow-up MRI of the brain with and without contrast in 6-12 months as a low-grade glioma could have this appearance. This finding was not included in the preliminary report, but was discussed with Dr. Carlena SaxBlair while the patient was in the emergency department by Dr. Etheleen MayhewLeach at 10:00 AM on 03/19/2018.   IMPRESSION/PLAN: Bilateral cubital tunnel syndrome. The results of his electrodiagnostic testing from today was reviewed which shows mild conduction velocity slowing across the elbow bilaterally. There is no evidence of carpal tunnel syndrome. Strategies to minimize compression and stretching of the ulnar nerves at the elbow was discussed. He was instructed to start use a soft elbow pad as well as sleeping with arms minimally flexed at the elbow. All questions were answered.  Greater than 50% of this 15 minute visit was spent in counseling, explanation of diagnosis, planning of further management, and coordination of care.    Thank you for allowing me to participate in patient's care.  If I can answer any additional questions, I would be pleased to do so.    Sincerely,    Donika K. Allena KatzPatel, DO

## 2018-07-23 ENCOUNTER — Other Ambulatory Visit: Payer: Self-pay | Admitting: Family

## 2018-07-23 DIAGNOSIS — F411 Generalized anxiety disorder: Secondary | ICD-10-CM

## 2018-09-20 ENCOUNTER — Other Ambulatory Visit: Payer: Self-pay | Admitting: Family

## 2018-09-20 DIAGNOSIS — F411 Generalized anxiety disorder: Secondary | ICD-10-CM

## 2018-10-10 ENCOUNTER — Emergency Department (HOSPITAL_COMMUNITY): Payer: BLUE CROSS/BLUE SHIELD

## 2018-10-10 ENCOUNTER — Emergency Department (HOSPITAL_COMMUNITY)
Admission: EM | Admit: 2018-10-10 | Discharge: 2018-10-10 | Disposition: A | Discharge status: Home / Self Care | Payer: BLUE CROSS/BLUE SHIELD | Attending: Emergency Medicine | Admitting: Emergency Medicine

## 2018-10-10 ENCOUNTER — Encounter (HOSPITAL_COMMUNITY): Payer: Self-pay

## 2018-10-10 DIAGNOSIS — R519 Headache, unspecified: Secondary | ICD-10-CM

## 2018-10-10 DIAGNOSIS — R079 Chest pain, unspecified: Secondary | ICD-10-CM

## 2018-10-10 DIAGNOSIS — R51 Headache: Secondary | ICD-10-CM | POA: Insufficient documentation

## 2018-10-10 DIAGNOSIS — R450 Nervousness: Secondary | ICD-10-CM | POA: Diagnosis not present

## 2018-10-10 LAB — COMPREHENSIVE METABOLIC PANEL
ALT: 14 U/L (ref 0–44)
AST: 16 U/L (ref 15–41)
Albumin: 4.4 g/dL (ref 3.5–5.0)
Alkaline Phosphatase: 53 U/L (ref 38–126)
Anion gap: 7 (ref 5–15)
BUN: 14 mg/dL (ref 6–20)
CO2: 26 mmol/L (ref 22–32)
Calcium: 9.2 mg/dL (ref 8.9–10.3)
Chloride: 105 mmol/L (ref 98–111)
Creatinine, Ser: 1.24 mg/dL (ref 0.61–1.24)
GFR calc Af Amer: >60 mL/min (ref >60)
GFR calc non Af Amer: >60 mL/min (ref >60)
Glucose, Bld: 113 mg/dL — ABNORMAL HIGH (ref 70–99)
Potassium: 4.4 mmol/L (ref 3.5–5.1)
Sodium: 138 mmol/L (ref 135–145)
TOTAL PROTEIN: 7.1 g/dL (ref 6.5–8.1)
Total Bilirubin: 0.7 mg/dL (ref 0.3–1.2)

## 2018-10-10 LAB — CBC
HCT: 45.5 % (ref 39.0–52.0)
Hemoglobin: 15.8 g/dL (ref 13.0–17.0)
MCH: 29.2 pg (ref 26.0–34.0)
MCHC: 34.7 g/dL (ref 30.0–36.0)
MCV: 84.1 fL (ref 80.0–100.0)
Platelets: 212 10*3/uL (ref 150–400)
RBC: 5.41 MIL/uL (ref 4.22–5.81)
RDW: 12.4 % (ref 11.5–15.5)
WBC: 7.2 10*3/uL (ref 4.0–10.5)
nRBC: 0 % (ref 0.0–0.2)

## 2018-10-10 LAB — I-STAT TROPONIN, ED: Troponin i, poc: 0 ng/mL (ref 0.00–0.08)

## 2018-10-10 MED ORDER — CARBAMAZEPINE ER 200 MG PO CP12: 200.0000 mg | ORAL_CAPSULE | Freq: Every day | ORAL | 1 refills | Status: DC

## 2018-10-10 MED ORDER — KETOROLAC TROMETHAMINE 15 MG/ML IJ SOLN
15.0000 mg | Freq: Once | INTRAMUSCULAR | Status: AC
  Administered 2018-10-10: 15 mg via INTRAVENOUS
  Filled 2018-10-10: qty 1

## 2018-10-10 MED ORDER — SODIUM CHLORIDE 0.9 % IV BOLUS
1000.0000 mL | Freq: Once | INTRAVENOUS | Status: AC
  Administered 2018-10-10: 1000 mL via INTRAVENOUS

## 2018-10-10 MED ORDER — PROCHLORPERAZINE EDISYLATE 10 MG/2ML IJ SOLN
10.0000 mg | Freq: Once | INTRAMUSCULAR | Status: AC
  Administered 2018-10-10: 10 mg via INTRAVENOUS
  Filled 2018-10-10: qty 2

## 2018-10-10 MED ORDER — DIPHENHYDRAMINE HCL 50 MG/ML IJ SOLN
25.0000 mg | Freq: Once | INTRAMUSCULAR | Status: AC
  Administered 2018-10-10: 25 mg via INTRAVENOUS
  Filled 2018-10-10: qty 1

## 2018-10-10 NOTE — ED Notes (Signed)
Patient transported to X-ray 

## 2018-10-10 NOTE — ED Notes (Signed)
Pt stable, ambulatory, states understanding of discharge instructions 

## 2018-10-10 NOTE — ED Provider Notes (Signed)
[facility] Emergency Department Provider Note MRN:  [id number]  Arrival date & time: 10/10/18     Chief Complaint   Chest Pain; Numbness; and Facial Pain   History of Present Illness   Stephen Oliver is a 38 y.o. year-old male with no pertinent past medical history presenting to the ED with chief complaint of chest pain, numbness, facial pain.  For over a year, patient has had unexplained left-sided numbness or paresthesias intermittently, has been worked up multiple times by neurologist with no evidence of stroke.  Patient also endorsing left-sided temporal ache or headache, described as a sharp shooting pain to the entire left side of the face.  Also endorsing intermittent left-sided chest pain during these episodes, symptoms have been more frequent recently, explains that the symptoms and the general malaise have been largely constant since Christmas, keeping him from performing his normal daily activities.  Denies fever, no cough, no unintentional weight loss, no shortness of breath, no abdominal pain, currently with no numbness or weakness to the arms or legs.  Review of Systems  A complete 10 system review of systems was obtained and all systems are negative except as noted in the HPI and PMH.   Patient's Health History   History reviewed. No pertinent past medical history.  Past Surgical History:  Procedure Laterality Date  . TONSILLECTOMY      Family History  Problem Relation Age of Onset  . Colon cancer Mother [phone]       colon  . Heart disease Father   . Healthy Sister   . Stroke Paternal Grandmother     Social History   Socioeconomic History  . Marital status: Married    Spouse name: Not on file  . Number of children: 4  . Years of education: 7314  . Highest education level: Not on file  Occupational History  . Occupation: stone mason/sales  Social Needs  . Financial resource strain: Not on file  . Food insecurity:    Worry: Not on file    Inability:  Not on file  . Transportation needs:    Medical: Not on file    Non-medical: Not on file  Tobacco Use  . Smoking status: Never Smoker  . Smokeless tobacco: Never Used  Substance and Sexual Activity  . Alcohol use: No  . Drug use: No  . Sexual activity: Not on file  Lifestyle  . Physical activity:    Days per week: Not on file    Minutes per session: Not on file  . Stress: Not on file  Relationships  . Social connections:    Talks on phone: Not on file    Gets together: Not on file    Attends religious service: Not on file    Active member of club or organization: Not on file    Attends meetings of clubs or organizations: Not on file    Relationship status: Not on file  . Intimate partner violence:    Fear of current or ex partner: Not on file    Emotionally abused: Not on file    Physically abused: Not on file    Forced sexual activity: Not on file  Other Topics Concern  . Not on file  Social History Narrative   Lives with wife and 4 children in a one story home.  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  Education: some college.      Physical Exam  Vital Signs and Nursing  Notes reviewed Vitals:   10/10/18 1716 10/10/18 1800  BP: (!) 124/93 (!) 125/96  Pulse: (!) 58 75  Resp: 14 16  Temp:    SpO2: 99% 100%    CONSTITUTIONAL: Well-appearing, NAD NEURO:  Alert and oriented x 3, no focal deficits EYES:  eyes equal and reactive ENT/NECK:  no LAD, no JVD CARDIO: Regular rate, well-perfused, normal S1 and S2 PULM:  CTAB no wheezing or rhonchi GI/GU:  normal bowel sounds, non-distended, non-tender MSK/SPINE:  No gross deformities, no edema SKIN:  no rash, atraumatic PSYCH:  Appropriate speech and behavior  Diagnostic and Interventional Summary    EKG Interpretation  Date/Time:  Sunday October 10 2018 15:50:06 EST Ventricular Rate:  84 PR Interval:  136 QRS Duration: 108 QT Interval:  380 QTC Calculation: 449 R Axis:   66 Text  Interpretation:  Normal sinus rhythm with sinus arrhythmia Normal ECG Confirmed by Kennis CarinaBero, Ulyses Panico [phone]([id number]) on 10/10/2018 4:30:43 PM      Labs Reviewed  COMPREHENSIVE METABOLIC PANEL - Abnormal; Notable for the following components:      Result Value   Glucose, Bld 113 (*)    All other components within normal limits  CBC  I-STAT TROPONIN, ED    DG Chest 2 View  Final Result      Medications  sodium chloride 0.9 % bolus 1,000 mL (0 mLs Intravenous Stopped 10/10/18 1921)  ketorolac (TORADOL) 15 MG/ML injection 15 mg (15 mg Intravenous Given 10/10/18 1801)  diphenhydrAMINE (BENADRYL) injection 25 mg (25 mg Intravenous Given 10/10/18 1801)  prochlorperazine (COMPAZINE) injection 10 mg (10 mg Intravenous Given 10/10/18 1801)     Procedures Critical Care  ED Course and Medical Decision Making  I have reviewed the triage vital signs and the nursing notes.  Pertinent labs & imaging results that were available during my care of the patient were reviewed by me and considered in my medical decision making (see below for details).  Very atypical chest pain in this 38 year old male who has had unexplained neurological symptoms for over a year.  Currently with a normal neurological exam, no meningismus, vital signs stable.  Will screen with EKG and troponin but favoring more of a complex migraine given the headache with positive neurological features.  Will provide migraine cocktail here to see if that provides any improvement in his symptoms.  Migraine cocktail largely not helpful.  I was called into the room on a few occasions in response to patient having his typical symptoms, which he could not fully describe.  He exhibited no neurological deficits at any time during my repeated evaluations here in the emergency department.  He has had thorough work-ups multiple times, no indication for further testing today.  Will provide carbamazepine prescription at a low dose given the possibility of trigeminal  neuralgia, advised to follow-up with his neurologist to help him with this dosing or to see if this is a worthwhile medication to continue.  After the discussed management above, the patient was determined to be safe for discharge.  The patient was in agreement with this plan and all questions regarding their care were answered.  ED return precautions were discussed and the patient will return to the ED with any significant worsening of condition.  Elmer SowMichael M. Pilar PlateBero, MD [facility] [email or web address]  Final Clinical Impressions(s) / ED Diagnoses     ICD-10-CM   1. Jittery feeling R45.0   2. Chest pain R07.9 DG Chest 2 View  DG Chest 2 View  3. Facial pain R51     ED Discharge Orders         Ordered    carbamazepine (CARBATROL) 200 MG 12 hr capsule  Daily     10/10/18 1921             Sabas Sous, MD 10/10/18 1924

## 2018-10-10 NOTE — Discharge Instructions (Addendum)
You were evaluated in the Emergency Department and after careful evaluation, we did not find any emergent condition requiring admission or further testing in the hospital.  We found no evidence of heart damage today.  You can try the medication provided to treat your possible trigeminal neuralgia.  Please take the medication as directed, 1 tablet daily.  In 1 week, you can increase to taking it twice a day.  Please call your neurologist to help you increase this medication safely.  As discussed, side effects include dizziness, drowsiness.  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  Please return to the Emergency Department if you experience any worsening of your condition.  We encourage you to follow up with a primary care provider.  Thank you for allowing [place] to be a part of your care.

## 2018-10-10 NOTE — ED Triage Notes (Signed)
Pt c/o left arm tingling, numbness and burning intermittently over a year.  Symptoms are steady since Christmas Eve.   Left side of face intermittently feels swollen and burning. Left chest pain is new and pt is concerned about that.

## 2018-10-10 NOTE — ED Notes (Signed)
Family requesting to speak to EDP. States that patient is currently having "episode" that they believe he should see.  EDP Bero called to bedside

## 2018-10-30 DIAGNOSIS — W57XXXA Bitten or stung by nonvenomous insect and other nonvenomous arthropods, initial encounter: Secondary | ICD-10-CM | POA: Insufficient documentation

## 2018-10-30 DIAGNOSIS — S30861A Insect bite (nonvenomous) of abdominal wall, initial encounter: Secondary | ICD-10-CM | POA: Insufficient documentation

## 2018-11-24 ENCOUNTER — Ambulatory Visit: Payer: BLUE CROSS/BLUE SHIELD | Admitting: Family Medicine

## 2018-11-24 ENCOUNTER — Encounter: Payer: Self-pay | Admitting: Family Medicine

## 2018-11-24 VITALS — BP 133/87 | HR 85 | Temp 98.8°F | Ht 69.0 in | Wt 172.2 lb

## 2018-11-24 DIAGNOSIS — R198 Other specified symptoms and signs involving the digestive system and abdomen: Secondary | ICD-10-CM | POA: Diagnosis not present

## 2018-11-24 DIAGNOSIS — M2550 Pain in unspecified joint: Secondary | ICD-10-CM

## 2018-11-24 DIAGNOSIS — R202 Paresthesia of skin: Secondary | ICD-10-CM

## 2018-11-24 DIAGNOSIS — R109 Unspecified abdominal pain: Secondary | ICD-10-CM
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
## 2018-11-29 ENCOUNTER — Ambulatory Visit: Payer: BLUE CROSS/BLUE SHIELD | Admitting: Family Medicine

## 2018-11-29 ENCOUNTER — Telehealth: Payer: Self-pay | Admitting: Family Medicine

## 2018-11-29 LAB — VITAMIN B12: Vitamin B-12: 457 pg/mL (ref 232–1245)

## 2018-11-29 LAB — ALPHA-GAL PANEL
Alpha Gal IgE*: 2.17 kU/L — ABNORMAL HIGH (ref <0.10)
Beef (Bos spp) IgE: 0.93 kU/L — ABNORMAL HIGH (ref <0.35)
Class Interpretation: 2
Lamb/Mutton (Ovis spp) IgE: 0.1 kU/L (ref <0.35)
Pork (Sus spp) IgE: 0.29 kU/L (ref <0.35)

## 2018-11-29 LAB — CBC WITH DIFFERENTIAL/PLATELET
Basophils Absolute: 0.1 10*3/uL (ref 0.0–0.2)
Basos: 1 %
EOS (ABSOLUTE): 0.1 10*3/uL (ref 0.0–0.4)
Eos: 2 %
Hematocrit: 45.7 % (ref 37.5–51.0)
Hemoglobin: 15.5 g/dL (ref 13.0–17.7)
Immature Grans (Abs): 0 10*3/uL (ref 0.0–0.1)
Immature Granulocytes: 0 %
Lymphocytes Absolute: 1.4 10*3/uL (ref 0.7–3.1)
Lymphs: 31 %
MCH: 29 pg (ref 26.6–33.0)
MCHC: 33.9 g/dL (ref 31.5–35.7)
MCV: 85 fL (ref 79–97)
MONOS ABS: 0.5 10*3/uL (ref 0.1–0.9)
Monocytes: 11 %
Neutrophils Absolute: 2.5 10*3/uL (ref 1.4–7.0)
Neutrophils: 55 %
Platelets: 190 10*3/uL (ref 150–450)
RBC: 5.35 x10E6/uL (ref 4.14–5.80)
RDW: 12.8 % (ref 11.6–15.4)
WBC: 4.6 10*3/uL (ref 3.4–10.8)

## 2018-11-29 LAB — GLIA (IGA/G) + TTG IGA
Antigliadin Abs, IgA: 3 units (ref 0–19)
Gliadin IgG: 1 units (ref 0–19)
Transglutaminase IgA: <2 U/mL (ref 0–3)

## 2018-11-29 LAB — SEDIMENTATION RATE: Sed Rate: 2 mm/hr (ref 0–15)

## 2018-11-29 LAB — LYME AB/WESTERN BLOT REFLEX: Lyme IgG/IgM Ab: <0.91 {ISR} (ref 0.00–0.90)

## 2018-11-29 NOTE — Telephone Encounter (Signed)
Pt aware that it will be later when MD looks at labs.

## 2018-11-29 NOTE — Telephone Encounter (Signed)
PT has specialist apt this afternoon at 1230 and is needing to know results of his labs, the provider has not reviewed them yet

## 2018-12-08 ENCOUNTER — Other Ambulatory Visit: Payer: Self-pay

## 2018-12-08 ENCOUNTER — Encounter: Payer: Self-pay | Admitting: Family Medicine

## 2018-12-08 ENCOUNTER — Ambulatory Visit (INDEPENDENT_AMBULATORY_CARE_PROVIDER_SITE_OTHER): Payer: BLUE CROSS/BLUE SHIELD | Admitting: Family Medicine

## 2018-12-08 VITALS — BP 136/89 | HR 75 | Temp 97.7°F | Ht 69.0 in | Wt 169.2 lb

## 2018-12-08 DIAGNOSIS — R202 Paresthesia of skin: Secondary | ICD-10-CM

## 2018-12-08 DIAGNOSIS — M2550 Pain in unspecified joint: Secondary | ICD-10-CM | POA: Diagnosis not present

## 2018-12-08 NOTE — Patient Instructions (Signed)
4-6 week elimination diet for milk products

## 2018-12-08 NOTE — Progress Notes (Signed)
Subjective:  Patient ID: Stephen Oliver, male    DOB: 04-21-1981  Age: 38 y.o. MRN: [id number]  CC: Lyme Disease (pt here to discuss Lyme Disease, saw a specialist and wants to discuss with Dr Darlyn Read)   HPI Elim Piel presents for patient is here for further discussion of his Lyme's disease.  He is seeing a Lyme specialist at [facility].  He was told that the Western blot test was only 80% sensitive and that they would do further testing.  He is to have a deep tissue massage and sit in a sauna after a heavy workout in order to go through the testing that they have ordered.  This should cause the elements responsible for positive Lyme's disease testing to become present in the serum.  Meanwhile they put him on an extended course of doxycycline.  Depression screen [facility] 2/9 12/08/2018 11/24/2018 05/21/2018  Decreased Interest 0 0 0  Down, Depressed, Hopeless 0 0 0  PHQ - 2 Score 0 0 0    History Nkosi has no past medical history on file.   He has a past surgical history that includes Tonsillectomy.   His family history includes Colon cancer (age of onset: 52) in his mother; Healthy in his sister; Heart disease in his father; Stroke in his paternal grandmother.He reports that he has never smoked. He has never used smokeless tobacco. He reports that he does not drink alcohol or use drugs.    ROS Review of Systems  Constitutional: Positive for fatigue.  Musculoskeletal: Positive for arthralgias.    Objective:  BP 136/89   Pulse 75   Temp 97.7 F (36.5 C) (Oral)   Ht 5\' 9"  (1.753 m)   Wt 169 lb 4 oz (76.8 kg)   BMI 24.99 kg/m   BP Readings from Last 3 Encounters:  12/08/18 136/89  11/24/18 133/87  10/10/18 (!) 128/91    Wt Readings from Last 3 Encounters:  12/08/18 169 lb 4 oz (76.8 kg)  11/24/18 172 lb 4 oz (78.1 kg)  10/10/18 165 lb (74.8 kg)     Physical Exam Constitutional:      Appearance: Normal appearance.  Neck:     Musculoskeletal: Normal range of  motion.  Cardiovascular:     Rate and Rhythm: Normal rate and regular rhythm.  Pulmonary:     Effort: Pulmonary effort is normal.     Breath sounds: Normal breath sounds.  Musculoskeletal: Normal range of motion.  Skin:    General: Skin is warm and dry.  Neurological:     General: No focal deficit present.     Mental Status: He is alert.  Psychiatric:        Mood and Affect: Mood normal.        Behavior: Behavior normal.        Thought Content: Thought content normal.       Assessment & Plan:   Amen was seen today for lyme disease.  Diagnoses and all orders for this visit:  Arthralgia, unspecified joint  Paresthesias       I am having Darin Engels maintain his ibuprofen.  Allergies as of 12/08/2018   No Known Allergies     Medication List       Accurate as of December 08, 2018 11:59 PM. Always use your most recent med list.        ibuprofen 200 MG tablet Commonly known as:  ADVIL,MOTRIN Take 600 mg by mouth every 6 (six) hours as needed.  Continue doxycycline and testing per [facility].  He should follow here after released from that practice.  Follow-up: Return if symptoms worsen or fail to improve.  Mechele Claude, M.D.

## 2018-12-13 ENCOUNTER — Encounter: Payer: Self-pay | Admitting: Family Medicine

## 2019-02-02 ENCOUNTER — Encounter: Payer: Self-pay | Admitting: Family Medicine

## 2019-02-02 ENCOUNTER — Other Ambulatory Visit: Payer: Self-pay

## 2019-02-02 ENCOUNTER — Ambulatory Visit: Payer: BLUE CROSS/BLUE SHIELD | Admitting: Family Medicine

## 2019-02-02 ENCOUNTER — Ambulatory Visit (INDEPENDENT_AMBULATORY_CARE_PROVIDER_SITE_OTHER): Payer: BLUE CROSS/BLUE SHIELD

## 2019-02-02 VITALS — BP 130/92 | HR 90 | Temp 97.3°F | Ht 69.0 in | Wt 163.4 lb

## 2019-02-02 DIAGNOSIS — S96811A Strain of other specified muscles and tendons at ankle and foot level, right foot, initial encounter: Secondary | ICD-10-CM

## 2019-02-02 DIAGNOSIS — W19XXXA Unspecified fall, initial encounter: Secondary | ICD-10-CM

## 2019-02-02 DIAGNOSIS — M25561 Pain in right knee: Secondary | ICD-10-CM
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
## 2019-03-11 ENCOUNTER — Telehealth: Payer: Self-pay | Admitting: Family Medicine

## 2019-03-11 ENCOUNTER — Other Ambulatory Visit: Payer: Self-pay

## 2019-03-14 ENCOUNTER — Ambulatory Visit (INDEPENDENT_AMBULATORY_CARE_PROVIDER_SITE_OTHER): Payer: BLUE CROSS/BLUE SHIELD | Admitting: Family Medicine

## 2019-03-14 ENCOUNTER — Other Ambulatory Visit: Payer: Self-pay

## 2019-03-14 ENCOUNTER — Encounter: Payer: Self-pay | Admitting: Family Medicine

## 2019-03-14 DIAGNOSIS — G939 Disorder of brain, unspecified: Secondary | ICD-10-CM

## 2019-03-14 DIAGNOSIS — R5382 Chronic fatigue, unspecified: Secondary | ICD-10-CM

## 2019-03-14 DIAGNOSIS — R200 Anesthesia of skin: Secondary | ICD-10-CM

## 2019-03-14 NOTE — Progress Notes (Signed)
Subjective:    Patient ID: Stephen Oliver, male    DOB: 02-13-1981, 38 y.o.   MRN: [id number]   HPI: Stephen Oliver is a 38 y.o. male presenting for positive tests for babesiosis, Bartonella, Lymes, mycoplasma and anaplasma and EBV  from [place]. Rotating antibiotics. Currently using azithro. Titrating per [facility]. Took eight weeks of doxycycline. Now taking antibiotics for four 4 months. Having headaches. Gets face drooping and numbness of left arm and leg. Describes it as bells palsy, but weakness extends beyond the face. Was told it would take 2-3 years to recover. Pt. Easily fatigued mentally. 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 Mind is not clear. Wsa told LYmes was in his brain and that hw may need IV antibiotics.  Also positive for alpha gal and and whey, cheese, and all dairy.Gets severe nausea with consuming these. Has some chicken nuggets that caused nausea a few days ago. Apparently the breading had whey.    Depression screen [facility] 2/9 12/08/2018 11/24/2018 05/21/2018 05/13/2018 11/06/2017  Decreased Interest 0 0 0 0 0  Down, Depressed, Hopeless 0 0 0 0 0  PHQ - 2 Score 0 0 0 0 0     Relevant past medical, surgical, family and social history reviewed and updated as indicated.  Interim medical history since our last visit reviewed. Allergies and medications reviewed and updated.  ROS:  Review of Systems  Constitutional: Positive for activity change and fatigue.  HENT: Negative.   Eyes: Negative for visual disturbance.  Respiratory: Negative for cough and shortness of breath.   Cardiovascular: Negative for chest pain and leg swelling.  Gastrointestinal: Positive for abdominal pain, nausea and vomiting. Negative for diarrhea.  Genitourinary: Negative for difficulty urinating.  Musculoskeletal: Negative for arthralgias and myalgias.  Skin: Negative for rash.  Neurological: Positive for weakness and headaches.  Psychiatric/Behavioral: Positive for  sleep disturbance.     Social History   Tobacco Use  Smoking Status Never Smoker  Smokeless Tobacco Never Used       Objective:     Wt Readings from Last 3 Encounters:  02/02/19 163 lb 6.4 oz (74.1 kg)  12/08/18 169 lb 4 oz (76.8 kg)  11/24/18 172 lb 4 oz (78.1 kg)     Exam deferred. Pt. Harboring due to COVID 19. Phone visit performed.   Assessment & Plan:   1. Chronic fatigue   2. Left facial numbness   3. Temporal lobe lesion     No orders of the defined types were placed in this encounter.   Orders Placed This Encounter  Procedures  . Ambulatory referral to Infectious Disease    Referral Priority:   Routine    Referral Type:   Consultation    Referral Reason:   Specialty Services Required    Requested Specialty:   Infectious Diseases    Number of Visits Requested:   1      Diagnoses and all orders for this visit:  Chronic fatigue -     Ambulatory referral to Infectious Disease  Left facial numbness  Temporal lobe lesion   Pt. Diagnoses by integrative health through innovative testing technique with Lymes, babesiosis, Bartonella, EBV (apparent reactivation) mycoplasma and anaplasma. I would like review of results by Infectious disease to determine disease state and appropriate rtreatment from observation to possible intrathrathecal antibiotics.  Virtual Visit via telephone Note  I discussed the limitations, risks, security and privacy concerns of performing an evaluation and management service by telephone and the availability  of in person appointments. The patient was identified with two identifiers. Pt.expressed understanding and agreed to proceed. Pt. Is at home. Dr. Darlyn ReadStacks is in his office.  Follow Up Instructions:   I discussed the assessment and treatment plan with the patient. The patient was provided an opportunity to ask questions and all were answered. The patient agreed with the plan and demonstrated an understanding of the instructions.    The patient was advised to call back or seek an in-person evaluation if the symptoms worsen or if the condition fails to improve as anticipated.   Total minutes including chart review and phone contact time: 28   Follow up plan: After release by I.D.  Mechele ClaudeWarren Damany Eastman, MD [facility]

## 2019-03-16 ENCOUNTER — Ambulatory Visit: Payer: BLUE CROSS/BLUE SHIELD | Admitting: Infectious Diseases

## 2019-03-16 ENCOUNTER — Encounter: Payer: Self-pay | Admitting: Infectious Diseases

## 2019-03-16 ENCOUNTER — Other Ambulatory Visit: Payer: Self-pay

## 2019-03-16 VITALS — BP 126/85 | HR 65 | Temp 98.0°F | Ht 69.0 in | Wt 159.1 lb

## 2019-03-16 DIAGNOSIS — M26629 Arthralgia of temporomandibular joint, unspecified side: Secondary | ICD-10-CM | POA: Insufficient documentation

## 2019-03-16 DIAGNOSIS — B6000 Babesiosis, unspecified: Secondary | ICD-10-CM

## 2019-03-16 DIAGNOSIS — S30861A Insect bite (nonvenomous) of abdominal wall, initial encounter: Secondary | ICD-10-CM | POA: Diagnosis not present

## 2019-03-16 DIAGNOSIS — M26621 Arthralgia of right temporomandibular joint: Secondary | ICD-10-CM | POA: Diagnosis not present

## 2019-03-16 DIAGNOSIS — W57XXXA Bitten or stung by nonvenomous insect and other nonvenomous arthropods, initial encounter: Secondary | ICD-10-CM

## 2019-03-16 NOTE — Patient Instructions (Signed)
Wash hunting/work clothes in permethrin every 3 weeks (do NOT use regular detergent when washing with permethrin). Apply barrier with 30% DEET when hunting. Re-apply every 4-6 hours. Stop taking azithromycin and doxycyline after 3 more days. Avoid hunting [place].
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
## 2019-07-03 ENCOUNTER — Encounter: Payer: Self-pay | Admitting: Infectious Diseases

## 2020-12-21 IMAGING — CR DG CHEST 2V
2 series · 2 of 2 positions shown · non-contrast
Comparison: None.

CLINICAL DATA: Left arm tingling.

EXAM:
CHEST - 2 VIEW

[chest pa]
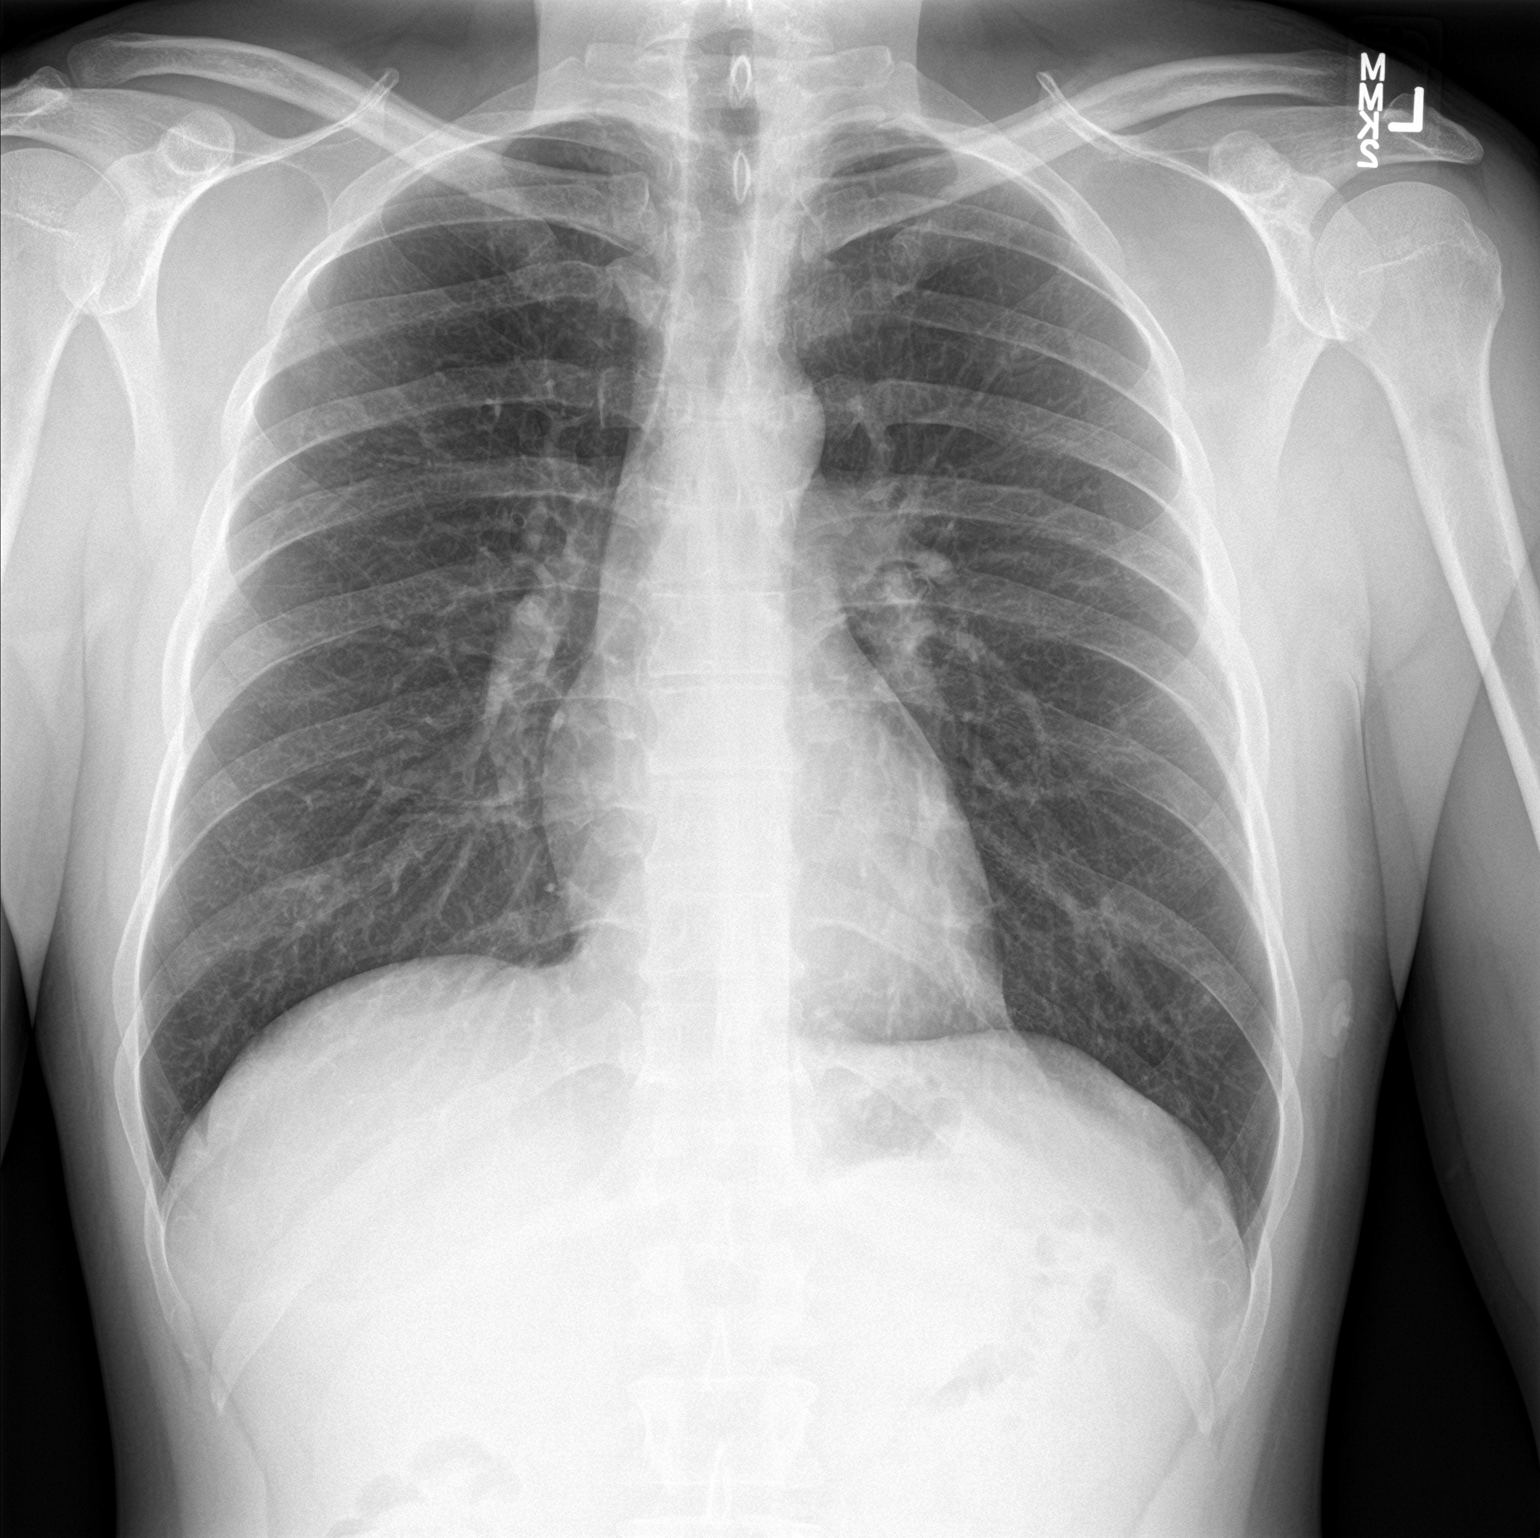

[chest lat]
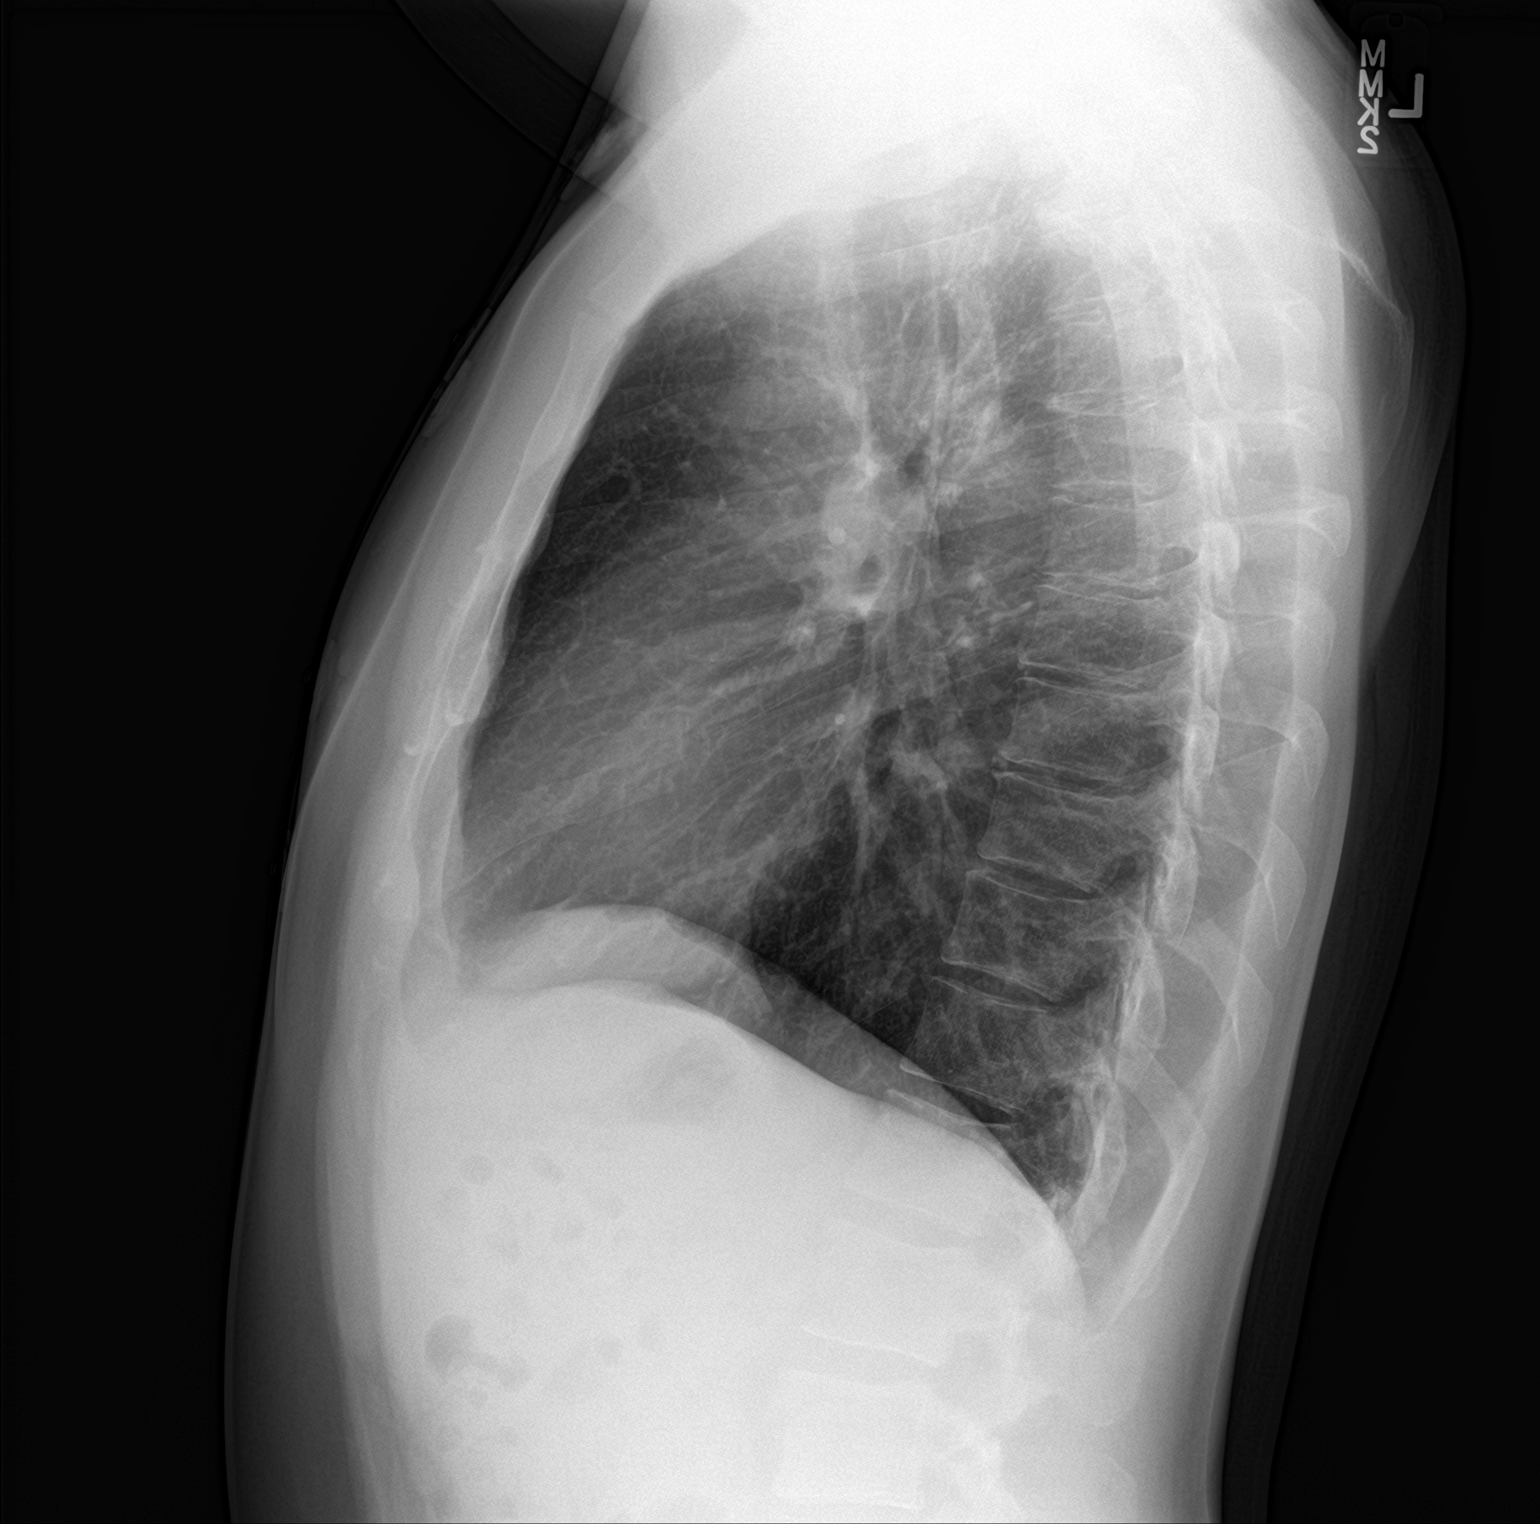

[2 of 2 positions shown; findings below may reference images not displayed]

FINDINGS: The heart size and mediastinal contours are within normal limits.
Both lungs are clear. The visualized skeletal structures are
unremarkable.
IMPRESSION: No active cardiopulmonary disease.
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
## 2021-05-21 ENCOUNTER — Encounter: Payer: Self-pay | Admitting: Nurse Practitioner

## 2021-05-21 ENCOUNTER — Ambulatory Visit (INDEPENDENT_AMBULATORY_CARE_PROVIDER_SITE_OTHER): Payer: 59 | Admitting: Nurse Practitioner

## 2021-05-21 ENCOUNTER — Other Ambulatory Visit: Payer: Self-pay

## 2021-05-21 VITALS — BP 122/83 | HR 64 | Temp 97.3°F | Ht 68.0 in | Wt 165.0 lb

## 2021-05-21 DIAGNOSIS — J029 Acute pharyngitis, unspecified: Secondary | ICD-10-CM | POA: Diagnosis not present

## 2021-05-21 LAB — RAPID STREP SCREEN (MED CTR MEBANE ONLY): Strep Gp A Ag, IA W/Reflex: NEGATIVE

## 2021-05-21 LAB — CULTURE, GROUP A STREP

## 2021-05-21 MED ORDER — AMOXICILLIN-POT CLAVULANATE 875-125 MG PO TABS: 1.0000 | ORAL_TABLET | Freq: Two times a day (BID) | ORAL | 0 refills | Status: AC

## 2021-05-21 NOTE — Assessment & Plan Note (Signed)
Symptoms not well controlled.  Completed strep test results negative.  Advised patient to increase hydration, warm salt gargle, Chloraseptic throat spray, Augmentin 875-125 mg tablet by mouth twice daily.   Follow up with unresolved symptoms.

## 2021-05-21 NOTE — Patient Instructions (Signed)
Sore Throat When you have a sore throat, your throat may feel: Tender. Burning. Irritated. Scratchy. Painful when you swallow. Painful when you talk. Many things can cause a sore throat, such as: An infection. Allergies. Dry air. Smoke or pollution. Radiation treatment. Gastroesophageal reflux disease (GERD). A tumor. A sore throat can be the first sign of another sickness. It can happen with other problems, like: Coughing. Sneezing. Fever. Swelling in the neck. Most sore throats go away without treatment. Follow these instructions at home:     Take over-the-counter medicines only as told by your doctor. If your child has a sore throat, do not give your child aspirin. Drink enough fluids to keep your pee (urine) pale yellow. Rest when you feel you need to. To help with pain: Sip warm liquids, such as broth, herbal tea, or warm water. Eat or drink cold or frozen liquids, such as frozen ice pops. Gargle with a salt-water mixture 3-4 times a day or as needed. To make a salt-water mixture, add -1 tsp (3-6 g) of salt to 1 cup (237 mL) of warm water. Mix it until you cannot see the salt anymore. Suck on hard candy or throat lozenges. Put a cool-mist humidifier in your bedroom at night. Sit in the bathroom with the door closed for 5-10 minutes while you run hot water in the shower. Do not use any products that contain nicotine or tobacco, such as cigarettes, e-cigarettes, and chewing tobacco. If you need help quitting, ask your doctor. Wash your hands well and often with soap and water. If soap and water are not available, use hand sanitizer. Contact a doctor if: You have a fever for more than 2-3 days. You keep having symptoms for more than 2-3 days. Your throat does not get better in 7 days. You have a fever and your symptoms suddenly get worse. Your child who is 3 months to 3 years old has a temperature of 102.2F (39C) or higher. Get help right away if: You have trouble  breathing. You cannot swallow fluids, soft foods, or your saliva. You have swelling in your throat or neck that gets worse. You keep feeling sick to your stomach (nauseous). You keep throwing up (vomiting). Summary A sore throat is pain, burning, irritation, or scratchiness in the throat. Many things can cause a sore throat. Take over-the-counter medicines only as told by your doctor. Do not give your child aspirin. Drink plenty of fluids, and rest as needed. Contact a doctor if your symptoms get worse or your sore throat does not get better within 7 days. This information is not intended to replace advice given to you by your health care provider. Make sure you discuss any questions you have with your healthcare provider. Document Revised: 02/15/2018 Document Reviewed: 02/15/2018 Elsevier Patient Education  2022 Elsevier Inc.  
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
